# Patient Record
Sex: Male | Born: 1960 | Race: White | Hispanic: No | Marital: Married | Smoking: Never smoker
Health system: Southern US, Community
[De-identification: ages and names within clinical notes are randomized; demographics above are authoritative.]

## PROBLEM LIST (undated history)

## (undated) DIAGNOSIS — I10 Essential (primary) hypertension: Secondary | ICD-10-CM

## (undated) DIAGNOSIS — J189 Pneumonia, unspecified organism: Secondary | ICD-10-CM

## (undated) DIAGNOSIS — K602 Anal fissure, unspecified: Secondary | ICD-10-CM

## (undated) DIAGNOSIS — K219 Gastro-esophageal reflux disease without esophagitis: Secondary | ICD-10-CM

## (undated) DIAGNOSIS — I4891 Unspecified atrial fibrillation: Secondary | ICD-10-CM

## (undated) DIAGNOSIS — I38 Endocarditis, valve unspecified: Secondary | ICD-10-CM

## (undated) DIAGNOSIS — E785 Hyperlipidemia, unspecified: Secondary | ICD-10-CM

## (undated) DIAGNOSIS — K5790 Diverticulosis of intestine, part unspecified, without perforation or abscess without bleeding: Secondary | ICD-10-CM

## (undated) DIAGNOSIS — K648 Other hemorrhoids: Secondary | ICD-10-CM

## (undated) DIAGNOSIS — I499 Cardiac arrhythmia, unspecified: Secondary | ICD-10-CM

## (undated) HISTORY — PX: KNEE SURGERY: SHX244

## (undated) HISTORY — PX: ESOPHAGOGASTRODUODENOSCOPY: SHX1529

## (undated) HISTORY — PX: COLONOSCOPY: SHX174

## (undated) HISTORY — PX: EYE SURGERY: SHX253

---

## 2006-05-30 ENCOUNTER — Encounter: Payer: Self-pay | Admitting: Orthopaedic Surgery

## 2008-04-27 ENCOUNTER — Other Ambulatory Visit: Payer: Self-pay

## 2008-04-27 ENCOUNTER — Ambulatory Visit: Payer: Self-pay | Admitting: Internal Medicine

## 2010-08-08 ENCOUNTER — Ambulatory Visit: Payer: Self-pay | Admitting: Unknown Physician Specialty

## 2010-08-10 LAB — PATHOLOGY REPORT

## 2011-09-05 ENCOUNTER — Ambulatory Visit: Payer: Self-pay | Admitting: Podiatry

## 2015-06-21 DIAGNOSIS — J189 Pneumonia, unspecified organism: Secondary | ICD-10-CM

## 2015-06-21 HISTORY — DX: Pneumonia, unspecified organism: J18.9

## 2015-07-04 ENCOUNTER — Ambulatory Visit
Admission: EM | Admit: 2015-07-04 | Discharge: 2015-07-04 | Disposition: A | Discharge status: Home / Self Care | Payer: Managed Care, Other (non HMO) | Attending: Family Medicine | Admitting: Family Medicine

## 2015-07-04 ENCOUNTER — Encounter: Payer: Self-pay | Admitting: Emergency Medicine

## 2015-07-04 ENCOUNTER — Ambulatory Visit (INDEPENDENT_AMBULATORY_CARE_PROVIDER_SITE_OTHER): Payer: Managed Care, Other (non HMO)

## 2015-07-04 DIAGNOSIS — J181 Lobar pneumonia, unspecified organism: Principal | ICD-10-CM

## 2015-07-04 DIAGNOSIS — J189 Pneumonia, unspecified organism: Secondary | ICD-10-CM

## 2015-07-04 HISTORY — DX: Gastro-esophageal reflux disease without esophagitis: K21.9

## 2015-07-04 HISTORY — DX: Essential (primary) hypertension: I10

## 2015-07-04 HISTORY — DX: Unspecified atrial fibrillation: I48.91

## 2015-07-04 MED ORDER — BENZONATATE 100 MG PO CAPS: 100.0000 mg | ORAL_CAPSULE | Freq: Three times a day (TID) | ORAL | Status: DC | PRN

## 2015-07-04 MED ORDER — AZITHROMYCIN 250 MG PO TABS
500.0000 mg | ORAL_TABLET | Freq: Every day | ORAL | Status: AC
Start: 2015-07-04 — End: 2015-07-08

## 2015-07-04 NOTE — ED Provider Notes (Signed)
[facility]  ____________________________________________  Time seen: Approximately 10:47 AM  I have reviewed the triage vital signs and the nursing notes.   HISTORY  Chief Complaint Cough   HPI Cody Caldwell is a 54 y.o. male presents with complaints of 1 week of cough and congestion. States runny nose and congestion have improved and now just with cough. States occasionally productive cough of white sputum but states most often a dry cough. States that he can feel congestion in his chest and states is almost like a rattle. Denies chest pain or shortness of breath. Denies wheezes. Denies fevers. Reports continues to eat and drink well. Denies known sick contacts. Denies weakness, dizziness, extremity swelling, calf pain, fever, neck or back pain.  Patient reports no recent antibodies. Reports nonsmoker.   Past Medical History  Diagnosis Date  . GERD (gastroesophageal reflux disease)   . Hypertension   . Atrial fibrillation [facility]): intermittent     There are no active problems to display for this patient.   Past Surgical History  Procedure Laterality Date  . Knee surgery Right     Current Outpatient Rx  Name  Route  Sig  Dispense  Refill  . aspirin 81 MG tablet   Oral   Take 81 mg by mouth daily.         Marland Kitchen lisinopril (PRINIVIL,ZESTRIL) 10 MG tablet   Oral   Take 10 mg by mouth daily.         Marland Kitchen omeprazole (PRILOSEC) 20 MG capsule   Oral   Take 20 mg by mouth daily.         . propafenone (RYTHMOL SR) 225 MG 12 hr capsule   Oral   Take 225 mg by mouth 2 (two) times daily.           Allergies Review of patient's allergies indicates no known allergies.  History reviewed. No pertinent family history.  Social History Social History  Substance Use Topics  . Smoking status: Never Smoker   . Smokeless tobacco: None  . Alcohol Use: Yes   PCP Nehemiah Massed  Review of Systems Constitutional: No fever/chills Eyes: No visual changes. ENT: No sore  throat. Positive runny nose, nasal congestion. Cardiovascular: Denies chest pain. Respiratory: Denies shortness of breath. Positive intermittent cough. Gastrointestinal: No abdominal pain.  No nausea, no vomiting.  No diarrhea.  No constipation. Genitourinary: Negative for dysuria. Musculoskeletal: Negative for back pain. Skin: Negative for rash. Neurological: Negative for headaches, focal weakness or numbness.  10-point ROS otherwise negative.  ____________________________________________   PHYSICAL EXAM:  VITAL SIGNS: ED Triage Vitals  Enc Vitals Group     BP 07/04/15 1007 142/96 mmHg     Pulse Rate 07/04/15 1007 83     Resp 07/04/15 1007 16     Temp 07/04/15 1007 97.2 F (36.2 C)     Temp Source 07/04/15 1007 Tympanic     SpO2 07/04/15 1007 96 %     Weight 07/04/15 1007 250 lb (113.399 kg)     Height 07/04/15 1007 6\' 1"  (1.854 m)     Head Cir --      Peak Flow --      Pain Score --      Pain Loc --      Pain Edu? --      Excl. in [place]? --     Constitutional: Alert and oriented. Well appearing and in no acute distress. Eyes: Conjunctivae are normal. PERRL. EOMI. Head: Atraumatic. No sinus tenderness  to palpation. No swelling. No erythema.  Ears: no erythema, normal TMs bilaterally.   Nose: Mild clear rhinorrhea.  Mouth/Throat: Mucous membranes are moist.  Oropharynx non-erythematous. No tonsillar swelling or exudate. No uvular shift or deviation. Neck: No stridor.  No cervical spine tenderness to palpation. Hematological/Lymphatic/Immunilogical: No cervical lymphadenopathy. Cardiovascular: Normal rate, regular rhythm. Grossly normal heart sounds.  Good peripheral circulation. Respiratory: Normal respiratory effort.  No retractions. Scattered right upper and lower lung rhonchi. No wheezes or rales. Good air movement. Dry intermittent cough in room. Gastrointestinal: Soft and nontender. No distention. Normal Bowel sounds.   No CVA tenderness. Musculoskeletal: No lower or  upper extremity tenderness nor edema.  No joint effusions. Bilateral pedal pulses equal and easily palpated.  Neurologic:  Normal speech and language. No gross focal neurologic deficits are appreciated. No gait instability. Skin:  Skin is warm, dry and intact. No rash noted. Psychiatric: Mood and affect are normal. Speech and behavior are normal.  ____________________________________________   LABS (all labs ordered are listed, but only abnormal results are displayed)  Labs Reviewed - No data to display  RADIOLOGY  EXAM: CHEST 2 VIEW  COMPARISON: None.  FINDINGS: Midline trachea. Normal heart size. Tortuous descending thoracic aorta. No pleural effusion or pneumothorax. Mild right hemidiaphragm elevation. Posterior and medial right upper lobe patchy pulmonary opacity. Clear left lung. Anterior lung scarring on the lateral view.  IMPRESSION: Patchy right upper lobe opacity, suspicious for pneumonia. Followup PA and lateral chest X-ray is recommended in 3-4 weeks following trial of antibiotic therapy to ensure resolution and exclude underlying malignancy.   Electronically Signed By: Abigail Miyamoto M.D. On: 07/04/2015 11:47  I, Marylene Land, personally viewed and evaluated these images (plain radiographs) as part of my medical decision making.     INITIAL IMPRESSION / ASSESSMENT AND PLAN / ED COURSE  Pertinent labs & imaging results that were available during my care of the patient were reviewed by me and considered in my medical decision making (see chart for details).  Very well-appearing patient. No acute distress. Presents for the complaints of 1 week of cough and congestion. States congestion has improved now with primarily chest congestion and cough. Denies chest pain or shortness of breath. Denies fevers. Scattered rhonchi right lower and upper lobe of will evaluate chest x-ray.   Chest x-ray patchy right upper lobe opacity. Will treat right upper lobe  pneumonia with oral azithromycin, when necessary tessalon perles as well as encouraged rest and fluids.Discussed follow up with Primary care physician this week an discussed with patient and encouraged repeat follow-up chest x-ray in approximately 3-4 weeks to ensure clearance.  Discussed follow up and return parameters including no resolution or any worsening concerns. Patient verbalized understanding and agreed to plan.   ____________________________________________   FINAL CLINICAL IMPRESSION(S) / ED DIAGNOSES  Final diagnoses:  Right upper lobe pneumonia       Marylene Land, NP 07/06/15 [place], NP 07/06/15 1102

## 2015-07-04 NOTE — ED Notes (Signed)
Patient c/o cough and chest congestion for a week.  Patient denies fevers.  °

## 2015-07-04 NOTE — Discharge Instructions (Signed)
Take medication as prescribed. Rest. Drink and eat well.   Follow-up closely with your primary care physician Dr. Kandice Robinsons. Return to the urgent care or proceed to emergency room for chest pain, shortness of breath, fever, palpitations, weakness, new or worsening concerns.  Community-Acquired Pneumonia, Adult Pneumonia is an infection of the lungs. One type of pneumonia can happen while a person is in a hospital. A different type can happen when a person is not in a hospital (community-acquired pneumonia). It is easy for this kind to spread from person to person. It can spread to you if you breathe near an infected person who coughs or sneezes. Some symptoms include:  A dry cough.  A wet (productive) cough.  Fever.  Sweating.  Chest pain. HOME CARE  Take over-the-counter and prescription medicines only as told by your doctor.  Only take cough medicine if you are losing sleep.  If you were prescribed an antibiotic medicine, take it as told by your doctor. Do not stop taking the antibiotic even if you start to feel better.  Sleep with your head and neck raised (elevated). You can do this by putting a few pillows under your head, or you can sleep in a recliner.  Do not use tobacco products. These include cigarettes, chewing tobacco, and e-cigarettes. If you need help quitting, ask your doctor.  Drink enough water to keep your pee (urine) clear or pale yellow. A shot (vaccine) can help prevent pneumonia. Shots are often suggested for:  People older than 55 years of age.  People older than 55 years of age:  Who are having cancer treatment.  Who have long-term (chronic) lung disease.  Who have problems with their body's defense system (immune system). You may also prevent pneumonia if you take these actions:  Get the flu (influenza) shot every year.  Go to the dentist as often as told.  Wash your hands often. If soap and water are not available, use hand sanitizer. GET HELP  IF:  You have a fever.  You lose sleep because your cough medicine does not help. GET HELP RIGHT AWAY IF:  You are short of breath and it gets worse.  You have more chest pain.  Your sickness gets worse. This is very serious if:  You are an older adult.  Your body's defense system is weak.  You cough up blood.   This information is not intended to replace advice given to you by your health care provider. Make sure you discuss any questions you have with your health care provider.   Document Released: 01/23/2008 Document Revised: 04/27/2015 Document Reviewed: 12/01/2014 Elsevier Interactive Patient Education [employer].

## 2015-09-09 ENCOUNTER — Ambulatory Visit
Admission: RE | Admit: 2015-09-09 | Payer: Managed Care, Other (non HMO) | Source: Ambulatory Visit | Admitting: Unknown Physician Specialty

## 2015-09-09 ENCOUNTER — Encounter: Admission: RE | Payer: Self-pay | Source: Ambulatory Visit

## 2015-09-09 SURGERY — COLONOSCOPY WITH PROPOFOL
Anesthesia: General

## 2015-11-10 ENCOUNTER — Encounter: Payer: Self-pay | Admitting: *Deleted

## 2015-11-11 ENCOUNTER — Ambulatory Visit
Admission: RE | Admit: 2015-11-11 | Discharge: 2015-11-11 | Disposition: A | Discharge status: Home / Self Care | Payer: Managed Care, Other (non HMO) | Source: Ambulatory Visit | Attending: Unknown Physician Specialty | Admitting: Unknown Physician Specialty

## 2015-11-11 ENCOUNTER — Ambulatory Visit: Payer: Managed Care, Other (non HMO) | Admitting: Anesthesiology

## 2015-11-11 ENCOUNTER — Encounter
Admission: RE | Disposition: A | Discharge status: Home / Self Care | Payer: Self-pay | Source: Ambulatory Visit | Attending: Unknown Physician Specialty

## 2015-11-11 ENCOUNTER — Encounter: Payer: Self-pay | Admitting: *Deleted

## 2015-11-11 DIAGNOSIS — K5730 Diverticulosis of large intestine without perforation or abscess without bleeding: Secondary | ICD-10-CM | POA: Diagnosis not present

## 2015-11-11 DIAGNOSIS — Z79899 Other long term (current) drug therapy: Secondary | ICD-10-CM | POA: Diagnosis not present

## 2015-11-11 DIAGNOSIS — I10 Essential (primary) hypertension: Secondary | ICD-10-CM | POA: Insufficient documentation

## 2015-11-11 DIAGNOSIS — E785 Hyperlipidemia, unspecified: Secondary | ICD-10-CM | POA: Insufficient documentation

## 2015-11-11 DIAGNOSIS — K2270 Barrett's esophagus without dysplasia: Secondary | ICD-10-CM | POA: Insufficient documentation

## 2015-11-11 DIAGNOSIS — Z9889 Other specified postprocedural states: Secondary | ICD-10-CM | POA: Insufficient documentation

## 2015-11-11 DIAGNOSIS — Z7982 Long term (current) use of aspirin: Secondary | ICD-10-CM | POA: Insufficient documentation

## 2015-11-11 DIAGNOSIS — I4891 Unspecified atrial fibrillation: Secondary | ICD-10-CM | POA: Insufficient documentation

## 2015-11-11 DIAGNOSIS — K640 First degree hemorrhoids: Secondary | ICD-10-CM | POA: Insufficient documentation

## 2015-11-11 DIAGNOSIS — I38 Endocarditis, valve unspecified: Secondary | ICD-10-CM | POA: Diagnosis not present

## 2015-11-11 DIAGNOSIS — K219 Gastro-esophageal reflux disease without esophagitis: Secondary | ICD-10-CM | POA: Diagnosis not present

## 2015-11-11 DIAGNOSIS — Z800 Family history of malignant neoplasm of digestive organs: Secondary | ICD-10-CM | POA: Diagnosis present

## 2015-11-11 DIAGNOSIS — K210 Gastro-esophageal reflux disease with esophagitis: Secondary | ICD-10-CM | POA: Insufficient documentation

## 2015-11-11 DIAGNOSIS — Z1211 Encounter for screening for malignant neoplasm of colon: Secondary | ICD-10-CM | POA: Insufficient documentation

## 2015-11-11 HISTORY — DX: Pneumonia, unspecified organism: J18.9

## 2015-11-11 HISTORY — PX: COLONOSCOPY WITH PROPOFOL: SHX5780

## 2015-11-11 HISTORY — PX: ESOPHAGOGASTRODUODENOSCOPY (EGD) WITH PROPOFOL: SHX5813

## 2015-11-11 HISTORY — DX: Endocarditis, valve unspecified: I38

## 2015-11-11 HISTORY — DX: Hyperlipidemia, unspecified: E78.5

## 2015-11-11 SURGERY — COLONOSCOPY WITH PROPOFOL
Anesthesia: General

## 2015-11-11 MED ORDER — GLYCOPYRROLATE 0.2 MG/ML IJ SOLN
INTRAMUSCULAR | Status: DC | PRN
  Administered 2015-11-11: 0.1 mg via INTRAVENOUS

## 2015-11-11 MED ORDER — PROPOFOL 500 MG/50ML IV EMUL
INTRAVENOUS | Status: DC | PRN
  Administered 2015-11-11: 140 ug/kg/min via INTRAVENOUS

## 2015-11-11 MED ORDER — LIDOCAINE HCL (CARDIAC) 20 MG/ML IV SOLN
INTRAVENOUS | Status: DC | PRN
  Administered 2015-11-11: 40 mg via INTRAVENOUS

## 2015-11-11 MED ORDER — SODIUM CHLORIDE 0.9 % IV SOLN
INTRAVENOUS | Status: DC
  Administered 2015-11-11: 09:00:00 via INTRAVENOUS

## 2015-11-11 MED ORDER — FENTANYL CITRATE (PF) 100 MCG/2ML IJ SOLN
INTRAMUSCULAR | Status: DC | PRN
  Administered 2015-11-11: 50 ug via INTRAVENOUS

## 2015-11-11 MED ORDER — MIDAZOLAM HCL 2 MG/2ML IJ SOLN
INTRAMUSCULAR | Status: DC | PRN
  Administered 2015-11-11: 1 mg via INTRAVENOUS

## 2015-11-11 MED ORDER — SODIUM CHLORIDE 0.9 % IV SOLN: INTRAVENOUS | Status: DC

## 2015-11-11 NOTE — H&P (Signed)
   Primary Care Physician:  Pcp Not In System Primary Gastroenterologist:  Dr. Vira Agar  Pre-Procedure History & Physical: HPI:  Cody Caldwell is a 55 y.o. male is here for a colonoscopy and upper endoscopy   Past Medical History  Diagnosis Date  . GERD (gastroesophageal reflux disease)   . Hypertension   . Atrial fibrillation ([place])   . Hyperlipidemia   . VHD (valvular heart disease)   . Pneumonia 06/2015    Past Surgical History  Procedure Laterality Date  . Knee surgery Right   . Colonoscopy    . Esophagogastroduodenoscopy      Prior to Admission medications   Medication Sig Start Date End Date Taking? Authorizing Provider  lisinopril (PRINIVIL,ZESTRIL) 10 MG tablet Take 10 mg by mouth daily.   Yes Historical Provider, MD  omeprazole (PRILOSEC) 20 MG capsule Take 20 mg by mouth daily.   Yes Historical Provider, MD  propafenone (RYTHMOL SR) 225 MG 12 hr capsule Take 225 mg by mouth 2 (two) times daily.   Yes Historical Provider, MD  aspirin 81 MG tablet Take 81 mg by mouth daily.    Historical Provider, MD  benzonatate (TESSALON PERLES) 100 MG capsule Take 1 capsule (100 mg total) by mouth 3 (three) times daily as needed for cough. 07/04/15   Marylene Land, NP    Allergies as of 09/20/2015  . (No Known Allergies)    History reviewed. No pertinent family history.  Social History   Social History  . Marital Status: Married    Spouse Name: N/A  . Number of Children: N/A  . Years of Education: N/A   Occupational History  . Not on file.   Social History Main Topics  . Smoking status: Never Smoker   . Smokeless tobacco: Never Used  . Alcohol Use: Yes  . Drug Use: Not on file  . Sexual Activity: Not on file   Other Topics Concern  . Not on file   Social History Narrative    Review of Systems: See HPI, otherwise negative ROS  Physical Exam: BP 135/82 mmHg  Pulse 57  Temp(Src) 97.2 F (36.2 C) (Tympanic)  Resp 12  Ht 6\' 1"  (1.854 m)  Wt 113.399 kg  (250 lb)  BMI 32.99 kg/m2  SpO2 100% General:   Alert,  pleasant and cooperative in NAD Head:  Normocephalic and atraumatic. Neck:  Supple; no masses or thyromegaly. Lungs:  Clear throughout to auscultation.    Heart:  Regular rate and rhythm. Abdomen:  Soft, nontender and nondistended. Normal bowel sounds, without guarding, and without rebound.   Neurologic:  Alert and  oriented x4;  grossly normal neurologically.  Impression/Plan: Cody Caldwell is here for an endoscopy and colonoscopy to be performed for [place] [facility]   Risks, benefits, limitations, and alternatives regarding  endoscopy and colonoscopy have been reviewed with the patient.  Questions have been answered.  All parties agreeable.   Gaylyn Cheers, MD  11/11/2015, 9:46 AM

## 2015-11-11 NOTE — Anesthesia Procedure Notes (Signed)
Performed by: COOK-MARTIN, Vernella Niznik Pre-anesthesia Checklist: Patient identified, Emergency Drugs available, Suction available, Patient being monitored and Timeout performed Patient Re-evaluated:Patient Re-evaluated prior to inductionOxygen Delivery Method: Nasal cannula Preoxygenation: Pre-oxygenation with 100% oxygen Intubation Type: IV induction Placement Confirmation: positive ETCO2 and CO2 detector       

## 2015-11-11 NOTE — Op Note (Signed)
[facility] Gastroenterology Patient Name: Cody Caldwell Procedure Date: 11/11/2015 9:51 AM MRN: [id number] Account #: [id number] Date of Birth: 09-26-1960 Admit Type: Outpatient Age: 55 Room: [facility] ENDO ROOM 1 Gender: Male Note Status: Finalized Procedure:            Upper GI endoscopy Indications:          Follow-up of Barrett's esophagus Providers:            Manya Silvas, MD Referring MD:         Shirline Frees (Referring MD) Medicines:            Propofol per Anesthesia Complications:        No immediate complications. Procedure:            Pre-Anesthesia Assessment:                       - After reviewing the risks and benefits, the patient                        was deemed in satisfactory condition to undergo the                        procedure.                       After obtaining informed consent, the endoscope was                        passed under direct vision. Throughout the procedure,                        the patient's blood pressure, pulse, and oxygen                        saturations were monitored continuously. The Endoscope                        was introduced through the mouth, and advanced to the                        second part of duodenum. The upper GI endoscopy was                        accomplished without difficulty. The patient tolerated                        the procedure well. Findings:      There were esophageal mucosal changes secondary to established       short-segment Barrett's disease present in the lower third of the       esophagus. The maximum longitudinal extent of these mucosal changes was       2 cm in length. Mucosa was biopsied with a cold forceps for histology.       One specimen bottle was sent to pathology.      The stomach was normal. Faint erythematous small spot.      The examined duodenum was normal. Faint erythematous spot minimal       significance. Impression:           - Esophageal mucosal  changes secondary to established  short-segment Barrett's disease. Biopsied.                       - Normal stomach.                       - Normal examined duodenum. Recommendation:       - Await pathology results. Manya Silvas, MD 11/11/2015 10:07:59 AM This report has been signed electronically. Number of Addenda: 0 Note Initiated On: 11/11/2015 9:51 AM      [facility]

## 2015-11-11 NOTE — Op Note (Signed)
[facility] Gastroenterology Patient Name: Cody Caldwell Procedure Date: 11/11/2015 9:47 AM MRN: [id number] Account #: [id number] Date of Birth: [birth year] Admit Type: Outpatient Age: 55 Room: [facility] ENDO ROOM 1 Gender: Male Note Status: Finalized Procedure:            Colonoscopy Indications:          Screening in patient at increased risk: Family history                        of 1st-degree relative with colorectal cancer Providers:            Manya Silvas, MD Referring MD:         Shirline Frees (Referring MD) Medicines:            Propofol per Anesthesia Complications:        No immediate complications. Procedure:            Pre-Anesthesia Assessment:                       - After reviewing the risks and benefits, the patient                        was deemed in satisfactory condition to undergo the                        procedure.                       After obtaining informed consent, the colonoscope was                        passed under direct vision. Throughout the procedure,                        the patient's blood pressure, pulse, and oxygen                        saturations were monitored continuously. The                        Colonoscope was introduced through the anus and                        advanced to the the cecum, identified by appendiceal                        orifice and ileocecal valve. The colonoscopy was                        performed without difficulty. The patient tolerated the                        procedure well. The quality of the bowel preparation                        was good. Findings:      Many small-mouthed diverticula were found in the sigmoid colon.      Internal hemorrhoids were found during endoscopy. The hemorrhoids were       small and Grade I (internal hemorrhoids that do not prolapse).      The  exam was otherwise without abnormality. Impression:           - Diverticulosis in the sigmoid colon.                  - Internal hemorrhoids.                       - The examination was otherwise normal.                       - No specimens collected. Recommendation:       - Repeat colonoscopy in 5 years for screening purposes. Manya Silvas, MD 11/11/2015 10:36:50 AM This report has been signed electronically. Number of Addenda: 0 Note Initiated On: 11/11/2015 9:47 AM Scope Withdrawal Time: 0 hours 15 minutes 10 seconds  Total Procedure Duration: 0 hours 23 minutes 17 seconds       [facility]

## 2015-11-11 NOTE — Anesthesia Preprocedure Evaluation (Signed)
Anesthesia Evaluation  Patient identified by MRN, date of birth, ID band Patient awake    Reviewed: Allergy & Precautions, H&P , NPO status , Patient's Chart, lab work & pertinent test results, reviewed documented beta blocker date and time   Airway Mallampati: II   Neck ROM: full    Dental  (+) Poor Dentition   Pulmonary neg pulmonary ROS, pneumonia, resolved,    Pulmonary exam normal        Cardiovascular hypertension, negative cardio ROS Normal cardiovascular examAtrial Fibrillation      Neuro/Psych negative neurological ROS  negative psych ROS   GI/Hepatic negative GI ROS, Neg liver ROS, GERD  ,  Endo/Other  negative endocrine ROS  Renal/GU negative Renal ROS  negative genitourinary   Musculoskeletal   Abdominal   Peds  Hematology negative hematology ROS (+)   Anesthesia Other Findings Past Medical History:   GERD (gastroesophageal reflux disease)                       Hypertension                                                 Atrial fibrillation (H[facility])                                    Hyperlipidemia                                               VHD (valvular heart disease)                                 Pneumonia                                       06/2015    Past Surgical History:   KNEE SURGERY                                    Right              COLONOSCOPY                                                   ESOPHAGOGASTRODUODENOSCOPY                                  BMI    Body Mass Index   32.99 kg/m 2     Reproductive/Obstetrics negative OB ROS                             Anesthesia Physical Anesthesia Plan  ASA: III  Anesthesia Plan: General   Post-op Pain Management:  Induction:   Airway Management Planned:   Additional Equipment:   Intra-op Plan:   Post-operative Plan:   Informed Consent: I have reviewed the patients History and Physical, chart,  labs and discussed the procedure including the risks, benefits and alternatives for the proposed anesthesia with the patient or authorized representative who has indicated his/her understanding and acceptance.   Dental Advisory Given  Plan Discussed with: CRNA  Anesthesia Plan Comments:         Anesthesia Quick Evaluation

## 2015-11-11 NOTE — Transfer of Care (Signed)
Immediate Anesthesia Transfer of Care Note  Patient: Cody Caldwell  Procedure(s) Performed: Procedure(s): COLONOSCOPY WITH PROPOFOL (N/A) ESOPHAGOGASTRODUODENOSCOPY (EGD) WITH PROPOFOL (N/A)  Patient Location: PACU  Anesthesia Type:General  Level of Consciousness: awake and sedated  Airway & Oxygen Therapy: Patient Spontanous Breathing and Patient connected to nasal cannula oxygen  Post-op Assessment: Report given to RN and Post -op Vital signs reviewed and stable  Post vital signs: Reviewed and stable  Last Vitals:  Filed Vitals:   11/11/15 0908  BP: 135/82  Pulse: 57  Temp: 36.2 C  Resp: 12    Complications: No apparent anesthesia complications

## 2015-11-12 ENCOUNTER — Encounter: Payer: Self-pay | Admitting: Unknown Physician Specialty

## 2015-11-14 LAB — SURGICAL PATHOLOGY

## 2015-11-14 NOTE — Anesthesia Postprocedure Evaluation (Signed)
Anesthesia Post Note  Patient: Cody Caldwell  Procedure(s) Performed: Procedure(s) (LRB): COLONOSCOPY WITH PROPOFOL (N/A) ESOPHAGOGASTRODUODENOSCOPY (EGD) WITH PROPOFOL (N/A)  Patient location during evaluation: PACU Anesthesia Type: General Level of consciousness: awake and alert Pain management: pain level controlled Vital Signs Assessment: post-procedure vital signs reviewed and stable Respiratory status: spontaneous breathing, nonlabored ventilation, respiratory function stable and patient connected to nasal cannula oxygen Cardiovascular status: blood pressure returned to baseline and stable Postop Assessment: no signs of nausea or vomiting Anesthetic complications: no    Last Vitals:  Filed Vitals:   11/11/15 1050 11/11/15 1100  BP: 112/90 127/92  Pulse: 66 60  Temp:    Resp: 21 16    Last Pain: There were no vitals filed for this visit.               Molli Barrows

## 2017-08-14 ENCOUNTER — Other Ambulatory Visit: Payer: Self-pay

## 2017-08-14 ENCOUNTER — Encounter: Payer: Self-pay | Admitting: Emergency Medicine

## 2017-08-14 ENCOUNTER — Ambulatory Visit (INDEPENDENT_AMBULATORY_CARE_PROVIDER_SITE_OTHER): Payer: Managed Care, Other (non HMO)

## 2017-08-14 ENCOUNTER — Ambulatory Visit
Admission: EM | Admit: 2017-08-14 | Discharge: 2017-08-14 | Disposition: A | Discharge status: Home / Self Care | Payer: Managed Care, Other (non HMO) | Attending: Emergency Medicine | Admitting: Emergency Medicine

## 2017-08-14 DIAGNOSIS — J069 Acute upper respiratory infection, unspecified: Secondary | ICD-10-CM

## 2017-08-14 DIAGNOSIS — R05 Cough: Secondary | ICD-10-CM

## 2017-08-14 DIAGNOSIS — B9789 Other viral agents as the cause of diseases classified elsewhere: Secondary | ICD-10-CM

## 2017-08-14 MED ORDER — AEROCHAMBER PLUS MISC: 2 refills | Status: DC

## 2017-08-14 MED ORDER — PREDNISONE 50 MG PO TABS: 50.0000 mg | ORAL_TABLET | Freq: Every day | ORAL | 0 refills | Status: AC

## 2017-08-14 MED ORDER — BENZONATATE 200 MG PO CAPS: 200.0000 mg | ORAL_CAPSULE | Freq: Three times a day (TID) | ORAL | 0 refills | Status: DC | PRN

## 2017-08-14 MED ORDER — HYDROCOD POLST-CPM POLST ER 10-8 MG/5ML PO SUER: 5.0000 mL | Freq: Two times a day (BID) | ORAL | 0 refills | Status: DC | PRN

## 2017-08-14 MED ORDER — ALBUTEROL SULFATE HFA 108 (90 BASE) MCG/ACT IN AERS
1.0000 | INHALATION_SPRAY | Freq: Four times a day (QID) | RESPIRATORY_TRACT | 0 refills | Status: DC | PRN
Start: 2017-08-14 — End: 2019-09-17

## 2017-08-14 MED ORDER — IPRATROPIUM-ALBUTEROL 0.5-2.5 (3) MG/3ML IN SOLN
3.0000 mL | Freq: Once | RESPIRATORY_TRACT | Status: AC
  Administered 2017-08-14: 3 mL via RESPIRATORY_TRACT

## 2017-08-14 MED ORDER — FLUTICASONE PROPIONATE 50 MCG/ACT NA SUSP: 2.0000 | Freq: Every day | NASAL | 0 refills | Status: DC

## 2017-08-14 NOTE — ED Triage Notes (Signed)
Patient c/o cough and chest congestion since Dec. 20.  Patient denies fevers.

## 2017-08-14 NOTE — ED Provider Notes (Signed)
HPI  SUBJECTIVE:  Cody Caldwell is a 56 y.o. male who presents with 3 days of a cough productive of whitish mucus, chest congestion.  He reports 1 day of a headache, but this is since resolved.  Reports wheezing today.  Also postnasal drip, scratchy, irritated sore throat.  No fevers, body aches, chest pain, shortness of breath, shortness of breath with exertion.  No nasal congestion, rhinorrhea.  He denies sick contacts.  Did not get a flu shot this year.  He tried Delsym with improvement in his cough.  No aggravating factors.  He had identical symptoms with pneumonia before and wants to make sure that he does not have recurrence.  No antibiotic in the past month.  No antipyretic in the past 6-8 hours.  He has a past medical history of pneumonia, atrial fibrillation for which he takes aspirin 325, hypertension for which he takes lisinopril.  No history of diabetes, asthma, eczema, COPD, smoking.  JAS:[id number], Everlean Cherry, MD     Past Medical History:  Diagnosis Date  . Atrial fibrillation ([place])   . GERD (gastroesophageal reflux disease)   . Hyperlipidemia   . Hypertension   . Pneumonia 06/2015  . VHD (valvular heart disease)     Past Surgical History:  Procedure Laterality Date  . COLONOSCOPY    . COLONOSCOPY WITH PROPOFOL N/A 11/11/2015   Procedure: COLONOSCOPY WITH PROPOFOL;  Surgeon: Manya Silvas, MD;  Location: [facility] ENDOSCOPY;  Service: Endoscopy;  Laterality: N/A;  . ESOPHAGOGASTRODUODENOSCOPY    . ESOPHAGOGASTRODUODENOSCOPY (EGD) WITH PROPOFOL N/A 11/11/2015   Procedure: ESOPHAGOGASTRODUODENOSCOPY (EGD) WITH PROPOFOL;  Surgeon: Manya Silvas, MD;  Location: [facility] ENDOSCOPY;  Service: Endoscopy;  Laterality: N/A;  . KNEE SURGERY Right     History reviewed. No pertinent family history.  Social History   Tobacco Use  . Smoking status: Never Smoker  . Smokeless tobacco: Never Used  Substance Use Topics  . Alcohol use: Yes  . Drug use: Not on file    No current  facility-administered medications for this encounter.   Current Outpatient Medications:  .  aspirin 81 MG tablet, Take 325 mg by mouth daily. , Disp: , Rfl:  .  lisinopril (PRINIVIL,ZESTRIL) 10 MG tablet, Take 10 mg by mouth daily., Disp: , Rfl:  .  omeprazole (PRILOSEC) 20 MG capsule, Take 20 mg by mouth daily., Disp: , Rfl:  .  propafenone (RYTHMOL SR) 225 MG 12 hr capsule, Take 225 mg by mouth 2 (two) times daily., Disp: , Rfl:  .  albuterol (PROVENTIL HFA;VENTOLIN HFA) 108 (90 Base) MCG/ACT inhaler, Inhale 1-2 puffs into the lungs every 6 (six) hours as needed for wheezing or shortness of breath., Disp: 1 Inhaler, Rfl: 0 .  benzonatate (TESSALON) 200 MG capsule, Take 1 capsule (200 mg total) by mouth 3 (three) times daily as needed for cough., Disp: 30 capsule, Rfl: 0 .  chlorpheniramine-HYDROcodone (TUSSIONEX PENNKINETIC ER) 10-8 MG/5ML SUER, Take 5 mLs by mouth every 12 (twelve) hours as needed for cough., Disp: 120 mL, Rfl: 0 .  fluticasone (FLONASE) 50 MCG/ACT nasal spray, Place 2 sprays into both nostrils daily., Disp: 16 g, Rfl: 0 .  predniSONE (DELTASONE) 50 MG tablet, Take 1 tablet (50 mg total) by mouth daily with breakfast for 5 days., Disp: 5 tablet, Rfl: 0 .  Spacer/Aero-Holding Chambers (AEROCHAMBER PLUS) inhaler, Use as instructed, Disp: 1 each, Rfl: 2  No Known Allergies   ROS  As noted in HPI.   Physical Exam  BP Marland Kitchen)  150/94 (BP Location: Left Arm)   Pulse 62   Temp 98.2 F (36.8 C) (Oral)   Resp 16   Ht 6\' 1"  (1.854 m)   Wt 245 lb (111.1 kg)   SpO2 98%   BMI 32.32 kg/m   Constitutional: Well developed, well nourished, no acute distress Eyes:  EOMI, conjunctiva normal bilaterally HENT: Normocephalic, atraumatic,mucus membranes moist and positive nasal congestion with erythematous, swollen turbinates.  No obvious postnasal drip.  No cobblestoning. Respiratory: Normal inspiratory effort fair air movement, positive scattered rales and rhonchi. Cardiovascular:  Normal rate, regular rhythm no murmurs rubs or gallops GI: nondistended skin: No rash, skin intact Musculoskeletal: no deformities Neurologic: Alert & oriented x 3, no focal neuro deficits Psychiatric: Speech and behavior appropriate   ED Course   Medications  ipratropium-albuterol (DUONEB) 0.5-2.5 (3) MG/3ML nebulizer solution 3 mL (3 mLs Nebulization Given 08/14/17 1300)    Orders Placed This Encounter  Procedures  . DG Chest 2 View    Standing Status:   Standing    Number of Occurrences:   1    Order Specific Question:   Reason for Exam (SYMPTOM  OR DIAGNOSIS REQUIRED)    Answer:   cough r/o PNA pulm edema effusion    No results found for this or any previous visit (from the past 24 hour(s)). Dg Chest 2 View  Result Date: 08/14/2017 CLINICAL DATA:  Re- days of productive cough and chest congestion. History of valvular heart disease and atrial fibrillation. Never smoked. EXAM: CHEST  2 VIEW COMPARISON:  Chest x-ray of July 04, 2015 FINDINGS: The lungs are well-expanded and clear. The heart and pulmonary vascularity are normal. The mediastinum is normal in width. There is no pleural effusion. The bony thorax exhibits no acute abnormality. IMPRESSION: There is no active cardiopulmonary disease. Electronically Signed   By: David  Martinique M.D.   On: 08/14/2017 13:01    ED Clinical Impression  Viral URI with cough   ED Assessment/Plan   Will check chest x-ray, give DuoNeb and reevaluate.  Reviewed imaging independently.  No pneumonia.  See radiology report for full details.  On reevaluation, patient states that he feels better.  Lungs clear, improved air movement.  Presentation consistent with a viral URI/bronchitis.  Home with Flonase, albuterol inhaler with a spacer, a 5 days of prednisone 50 mg, Tussionex and Tessalon.  Follow-up with primary care physician as needed, to the ER if he gets worse.  Discussed imaging, MDM, plan and followup with patient. Discussed sn/sx  that should prompt return to the ED. patient agrees with plan.   Meds ordered this encounter  Medications  . ipratropium-albuterol (DUONEB) 0.5-2.5 (3) MG/3ML nebulizer solution 3 mL  . albuterol (PROVENTIL HFA;VENTOLIN HFA) 108 (90 Base) MCG/ACT inhaler    Sig: Inhale 1-2 puffs into the lungs every 6 (six) hours as needed for wheezing or shortness of breath.    Dispense:  1 Inhaler    Refill:  0  . Spacer/Aero-Holding Chambers (AEROCHAMBER PLUS) inhaler    Sig: Use as instructed    Dispense:  1 each    Refill:  2  . benzonatate (TESSALON) 200 MG capsule    Sig: Take 1 capsule (200 mg total) by mouth 3 (three) times daily as needed for cough.    Dispense:  30 capsule    Refill:  0  . chlorpheniramine-HYDROcodone (TUSSIONEX PENNKINETIC ER) 10-8 MG/5ML SUER    Sig: Take 5 mLs by mouth every 12 (twelve) hours as needed for cough.  Dispense:  120 mL    Refill:  0  . predniSONE (DELTASONE) 50 MG tablet    Sig: Take 1 tablet (50 mg total) by mouth daily with breakfast for 5 days.    Dispense:  5 tablet    Refill:  0  . fluticasone (FLONASE) 50 MCG/ACT nasal spray    Sig: Place 2 sprays into both nostrils daily.    Dispense:  16 g    Refill:  0    *This clinic note was created using [employer]. Therefore, there may be occasional mistakes despite careful proofreading.   ?   Melynda Ripple, MD 08/14/17 [phone]

## 2018-01-14 ENCOUNTER — Other Ambulatory Visit: Payer: Self-pay | Admitting: Student

## 2018-01-14 DIAGNOSIS — M1712 Unilateral primary osteoarthritis, left knee: Secondary | ICD-10-CM

## 2018-01-14 DIAGNOSIS — M25562 Pain in left knee: Secondary | ICD-10-CM

## 2018-01-25 ENCOUNTER — Ambulatory Visit
Admission: RE | Admit: 2018-01-25 | Discharge: 2018-01-25 | Disposition: A | Discharge status: Home / Self Care | Payer: Managed Care, Other (non HMO) | Source: Ambulatory Visit | Attending: Student | Admitting: Student

## 2018-01-25 DIAGNOSIS — M2342 Loose body in knee, left knee: Secondary | ICD-10-CM | POA: Diagnosis not present

## 2018-01-25 DIAGNOSIS — S83272A Complex tear of lateral meniscus, current injury, left knee, initial encounter: Secondary | ICD-10-CM | POA: Insufficient documentation

## 2018-01-25 DIAGNOSIS — M1712 Unilateral primary osteoarthritis, left knee: Secondary | ICD-10-CM | POA: Diagnosis not present

## 2018-01-25 DIAGNOSIS — M25562 Pain in left knee: Secondary | ICD-10-CM | POA: Diagnosis present

## 2019-03-24 ENCOUNTER — Other Ambulatory Visit: Payer: Self-pay | Admitting: Nurse Practitioner

## 2019-03-24 ENCOUNTER — Ambulatory Visit
Admission: RE | Admit: 2019-03-24 | Discharge: 2019-03-24 | Disposition: A | Discharge status: Home / Self Care | Payer: Managed Care, Other (non HMO) | Source: Ambulatory Visit | Attending: Nurse Practitioner | Admitting: Nurse Practitioner

## 2019-03-24 ENCOUNTER — Other Ambulatory Visit: Payer: Self-pay

## 2019-03-24 DIAGNOSIS — R197 Diarrhea, unspecified: Secondary | ICD-10-CM

## 2019-03-24 DIAGNOSIS — K625 Hemorrhage of anus and rectum: Secondary | ICD-10-CM

## 2019-03-24 DIAGNOSIS — R1031 Right lower quadrant pain: Secondary | ICD-10-CM

## 2019-03-25 ENCOUNTER — Ambulatory Visit
Admission: RE | Admit: 2019-03-25 | Discharge: 2019-03-25 | Disposition: A | Discharge status: Home / Self Care | Payer: Managed Care, Other (non HMO) | Source: Ambulatory Visit | Attending: Nurse Practitioner | Admitting: Nurse Practitioner

## 2019-03-25 DIAGNOSIS — R197 Diarrhea, unspecified: Secondary | ICD-10-CM

## 2019-03-25 DIAGNOSIS — K625 Hemorrhage of anus and rectum: Secondary | ICD-10-CM | POA: Diagnosis present

## 2019-03-25 IMAGING — CT CT ABDOMEN AND PELVIS WITH CONTRAST
2 of 5 series · 16 of 46 positions shown, 18 images · IV contrast (APPLIED)
Comparison: None.

CLINICAL DATA: Patient complains of rectal bleeding which began 2
days ago.

EXAM:
CT ABDOMEN AND PELVIS WITH CONTRAST
TECHNIQUE: Multidetector CT imaging of the abdomen and pelvis was performed
using the standard protocol following bolus administration of
intravenous contrast.
CONTRAST:  100mL OMNIPAQUE IOHEXOL 300 MG/ML  SOLN

[Series 2: routine abd/pel with · axial · 0.80mm/px · z∈[-503,-63]mm · 13 of 100 slices shown, 15 images]
[im 6/100  soft-tissue]
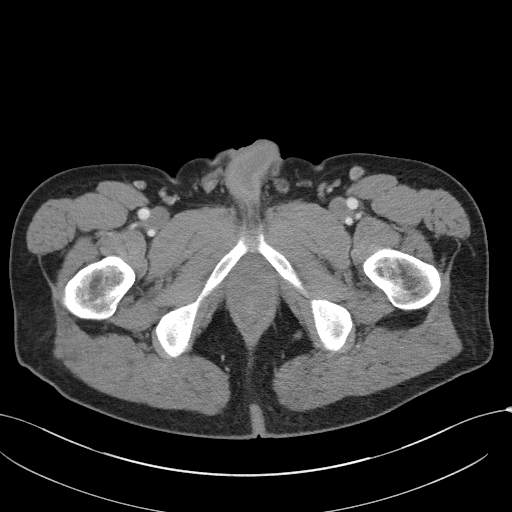
[im 6/100  bone]
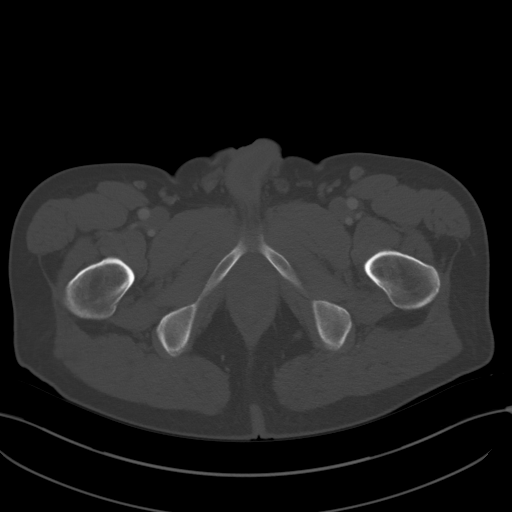
[im 12/100  soft-tissue]
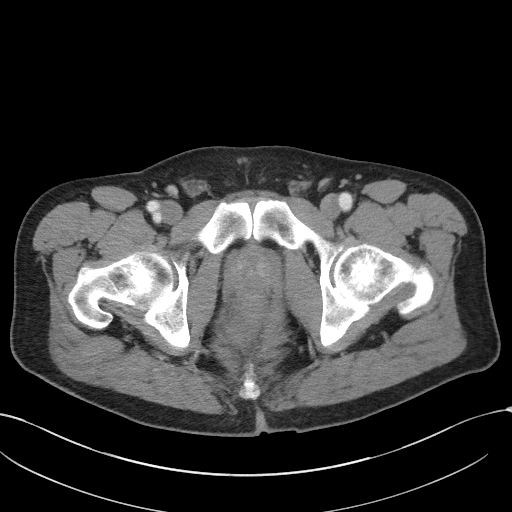
[im 23/100  soft-tissue]
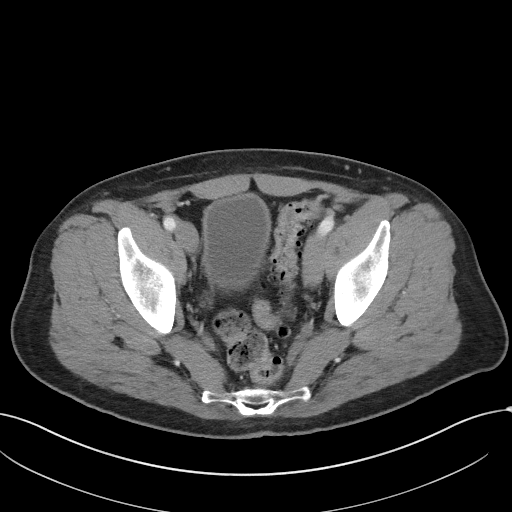
[im 28/100  soft-tissue]
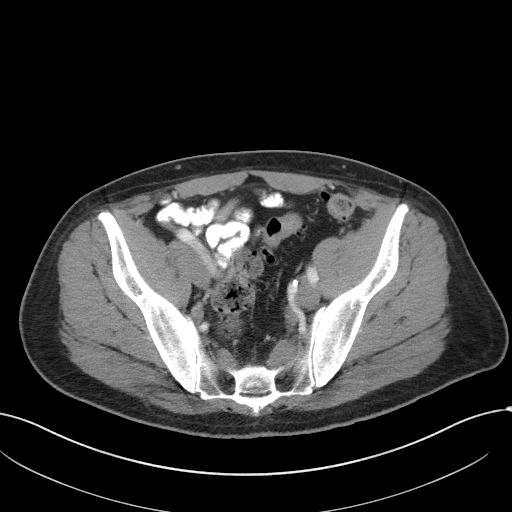
[im 34/100  soft-tissue]
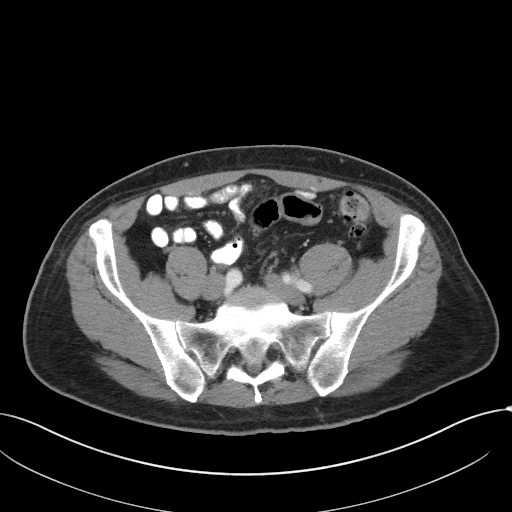
[im 45/100  soft-tissue]
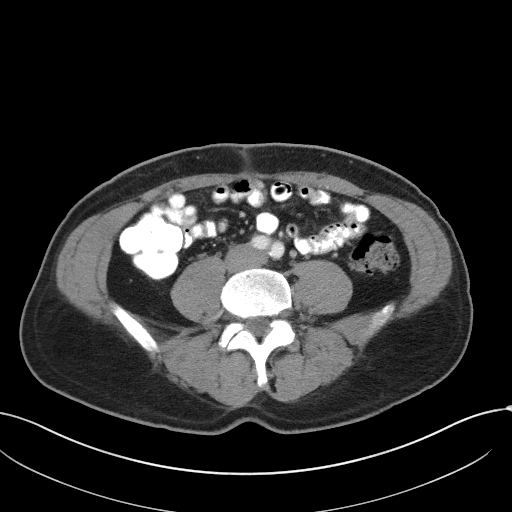
[im 50/100  soft-tissue]
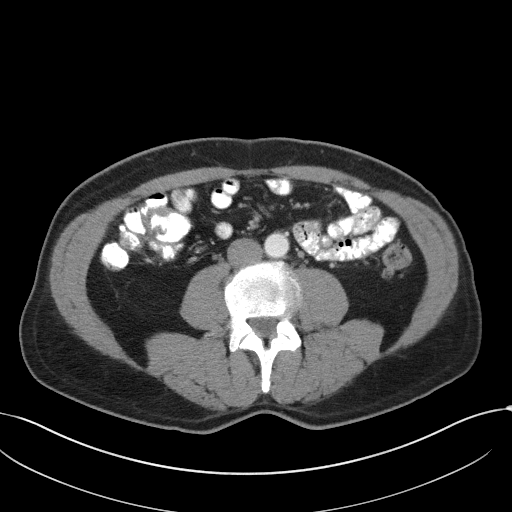
[im 56/100  soft-tissue]
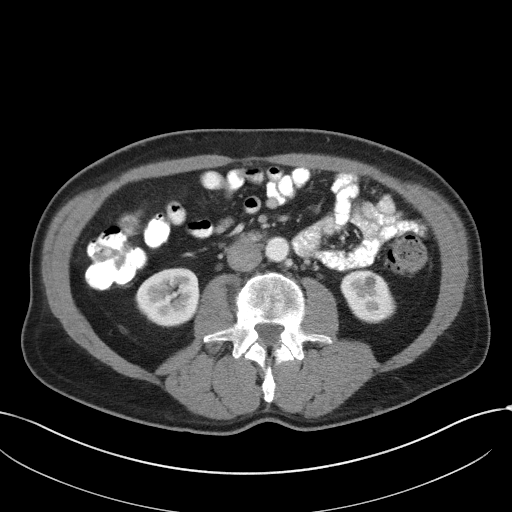
[im 67/100  soft-tissue]
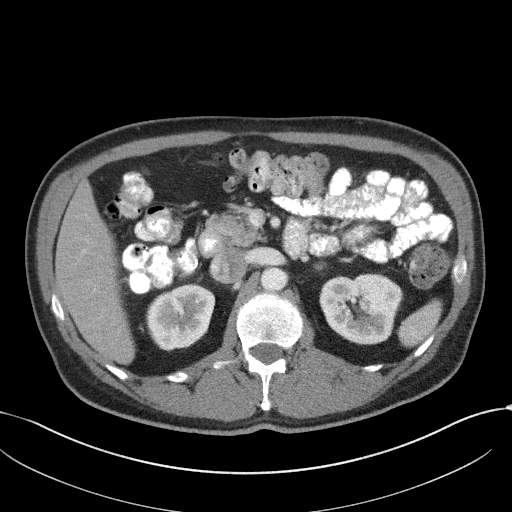
[im 67/100  bone]
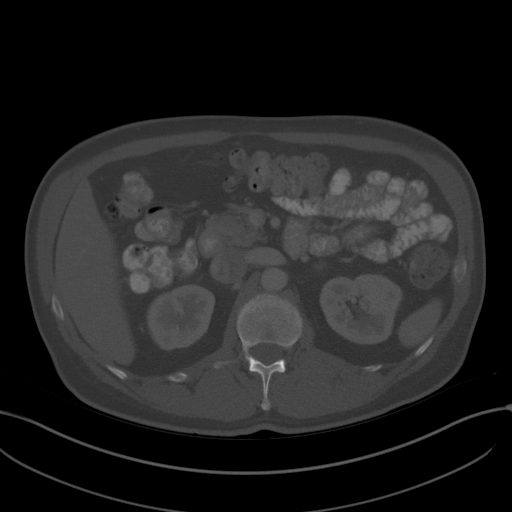
[im 72/100  soft-tissue]
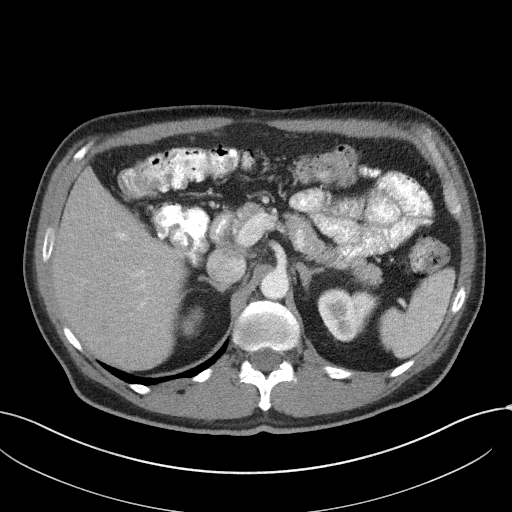
[im 78/100  soft-tissue]
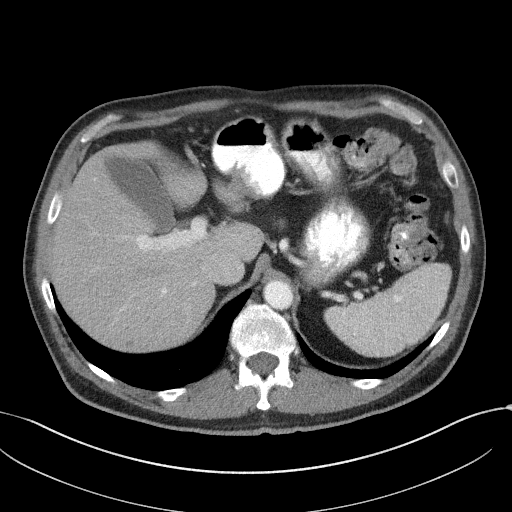
[im 89/100  soft-tissue]
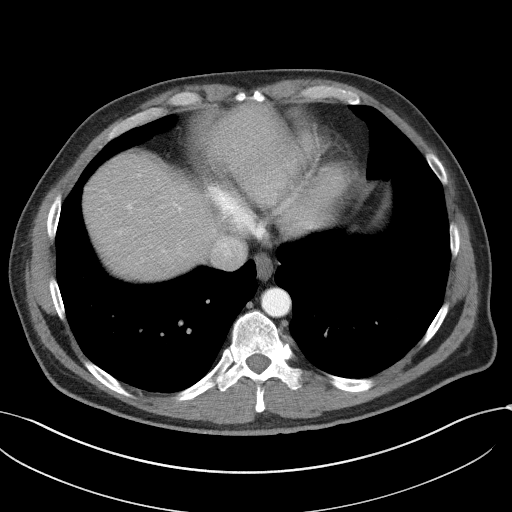
[im 94/100  soft-tissue]
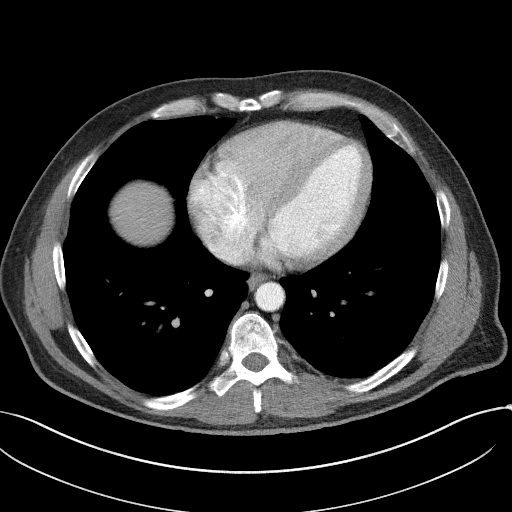

[Series 5: coronal st · coronal · 0.81mm/px · 3 of 99 slices shown]
[im 33/99  soft-tissue]
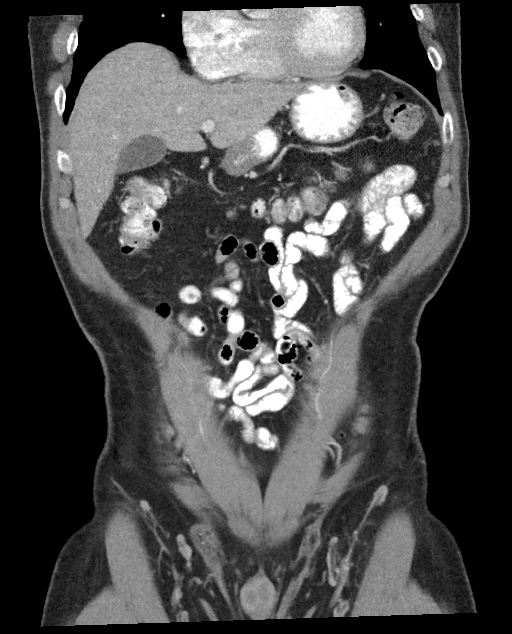
[im 44/99  soft-tissue]
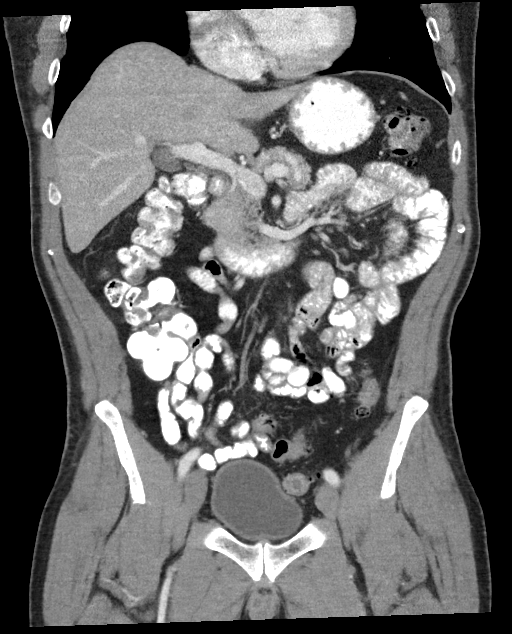
[im 55/99  soft-tissue]
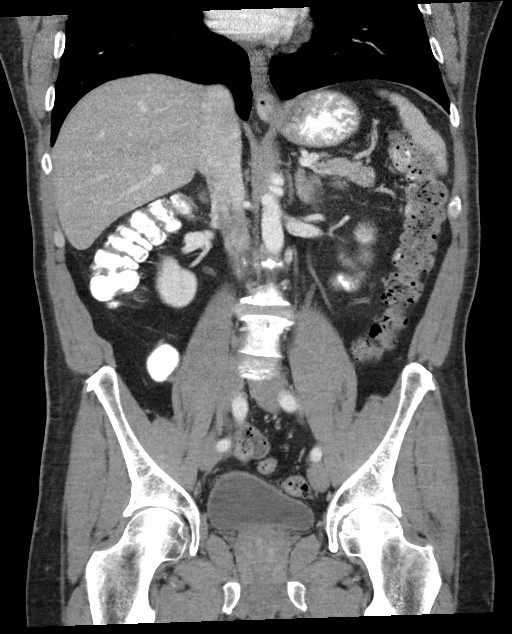

[16 of 46 positions shown; findings below may reference images not displayed]

FINDINGS: Lower chest: No acute abnormality.

Hepatobiliary: No focal liver abnormality is seen. No gallstones,
gallbladder wall thickening, or biliary dilatation.

Pancreas: Unremarkable. No pancreatic ductal dilatation or
surrounding inflammatory changes.

Spleen: Normal in size without focal abnormality.

Adrenals/Urinary Tract: Normal adrenal glands. Small, 6 mm
low-density structure in inferior pole of left kidney is unchanged,
too small to characterize. No mass or hydronephrosis identified. The
urinary bladder appears normal.

Stomach/Bowel: Stomach is normal. The small bowel loops have a
normal course and caliber. Appendix visualized and unremarkable.
Left-sided diverticulosis. No obstructing: Mass identified. There is
mild circumferential wall thickening involving the distal rectum
which measures up to 7.5 mm, image 87/2.

Vascular/Lymphatic: Mild aortic atherosclerosis.

No abdominal adenopathy.  No pelvic adenopathy.

Reproductive: Prostate is unremarkable.

Other: No abdominal wall hernia or abnormality. No abdominopelvic
ascites.

Musculoskeletal: No acute or significant osseous findings. Mild
lumbar scoliosis and degenerative disc disease.
IMPRESSION: 1. No acute findings identified within the abdomen or pelvis. No
obstructing mass identified within the colon.
2. There is mild circumferential wall thickening involving the
distal rectum, nonspecific. In the setting of rectal bleeding
recommend correlation with physical exam and direct visualization to
exclude colorectal neoplasm.
3. Colonic diverticulosis without acute inflammation.
4.  Aortic Atherosclerosis ([id number]).

## 2019-03-25 MED ORDER — IOHEXOL 300 MG/ML  SOLN
100.0000 mL | Freq: Once | INTRAMUSCULAR | Status: AC | PRN
  Administered 2019-03-25: 100 mL via INTRAVENOUS

## 2019-03-26 ENCOUNTER — Other Ambulatory Visit: Payer: Self-pay

## 2019-03-26 ENCOUNTER — Other Ambulatory Visit
Admission: RE | Admit: 2019-03-26 | Discharge: 2019-03-26 | Disposition: A | Discharge status: Home / Self Care | Payer: Managed Care, Other (non HMO) | Source: Ambulatory Visit | Attending: Internal Medicine | Admitting: Internal Medicine

## 2019-03-26 DIAGNOSIS — Z01812 Encounter for preprocedural laboratory examination: Secondary | ICD-10-CM | POA: Insufficient documentation

## 2019-03-26 DIAGNOSIS — Z20828 Contact with and (suspected) exposure to other viral communicable diseases: Secondary | ICD-10-CM | POA: Insufficient documentation

## 2019-03-26 LAB — SARS CORONAVIRUS 2 (TAT 6-24 HRS): SARS Coronavirus 2: NEGATIVE

## 2019-03-27 ENCOUNTER — Encounter: Payer: Self-pay | Admitting: *Deleted

## 2019-03-27 ENCOUNTER — Other Ambulatory Visit: Payer: Managed Care, Other (non HMO)

## 2019-03-30 ENCOUNTER — Ambulatory Visit: Payer: Managed Care, Other (non HMO) | Admitting: Certified Registered"

## 2019-03-30 ENCOUNTER — Ambulatory Visit
Admission: RE | Admit: 2019-03-30 | Discharge: 2019-03-30 | Disposition: A | Discharge status: Home / Self Care | Payer: Managed Care, Other (non HMO) | Source: Ambulatory Visit | Attending: Internal Medicine | Admitting: Internal Medicine

## 2019-03-30 ENCOUNTER — Encounter: Payer: Self-pay | Admitting: *Deleted

## 2019-03-30 ENCOUNTER — Other Ambulatory Visit: Payer: Self-pay

## 2019-03-30 ENCOUNTER — Encounter
Admission: RE | Disposition: A | Discharge status: Home / Self Care | Payer: Self-pay | Source: Ambulatory Visit | Attending: Internal Medicine

## 2019-03-30 DIAGNOSIS — Z79899 Other long term (current) drug therapy: Secondary | ICD-10-CM | POA: Insufficient documentation

## 2019-03-30 DIAGNOSIS — D124 Benign neoplasm of descending colon: Secondary | ICD-10-CM | POA: Diagnosis not present

## 2019-03-30 DIAGNOSIS — K219 Gastro-esophageal reflux disease without esophagitis: Secondary | ICD-10-CM | POA: Insufficient documentation

## 2019-03-30 DIAGNOSIS — K640 First degree hemorrhoids: Secondary | ICD-10-CM | POA: Diagnosis not present

## 2019-03-30 DIAGNOSIS — I10 Essential (primary) hypertension: Secondary | ICD-10-CM | POA: Diagnosis not present

## 2019-03-30 DIAGNOSIS — I4891 Unspecified atrial fibrillation: Secondary | ICD-10-CM | POA: Diagnosis not present

## 2019-03-30 DIAGNOSIS — Z7982 Long term (current) use of aspirin: Secondary | ICD-10-CM | POA: Diagnosis not present

## 2019-03-30 DIAGNOSIS — K5730 Diverticulosis of large intestine without perforation or abscess without bleeding: Secondary | ICD-10-CM | POA: Diagnosis not present

## 2019-03-30 DIAGNOSIS — D125 Benign neoplasm of sigmoid colon: Secondary | ICD-10-CM | POA: Insufficient documentation

## 2019-03-30 DIAGNOSIS — I38 Endocarditis, valve unspecified: Secondary | ICD-10-CM | POA: Diagnosis not present

## 2019-03-30 DIAGNOSIS — K921 Melena: Secondary | ICD-10-CM | POA: Diagnosis present

## 2019-03-30 HISTORY — DX: Anal fissure, unspecified: K60.2

## 2019-03-30 HISTORY — DX: Endocarditis, valve unspecified: I38

## 2019-03-30 HISTORY — PX: COLONOSCOPY WITH PROPOFOL: SHX5780

## 2019-03-30 SURGERY — COLONOSCOPY WITH PROPOFOL
Anesthesia: General

## 2019-03-30 MED ORDER — PROPOFOL 10 MG/ML IV BOLUS
INTRAVENOUS | Status: DC | PRN
  Administered 2019-03-30: 50 mg via INTRAVENOUS
  Administered 2019-03-30: 30 mg via INTRAVENOUS
  Administered 2019-03-30: 50 mg via INTRAVENOUS
  Administered 2019-03-30 (×2): 30 mg via INTRAVENOUS
  Administered 2019-03-30 (×3): 50 mg via INTRAVENOUS
  Administered 2019-03-30 (×5): 30 mg via INTRAVENOUS

## 2019-03-30 MED ORDER — SODIUM CHLORIDE 0.9 % IV SOLN
INTRAVENOUS | Status: DC
  Administered 2019-03-30: 08:00:00 via INTRAVENOUS

## 2019-03-30 MED ORDER — EPHEDRINE SULFATE 50 MG/ML IJ SOLN
INTRAMUSCULAR | Status: DC | PRN
  Administered 2019-03-30 (×2): 10 mg via INTRAVENOUS

## 2019-03-30 NOTE — Anesthesia Post-op Follow-up Note (Signed)
Anesthesia QCDR form completed.        

## 2019-03-30 NOTE — Anesthesia Postprocedure Evaluation (Signed)
Anesthesia Post Note  Patient: Cody Caldwell  Procedure(s) Performed: COLONOSCOPY WITH PROPOFOL (N/A )  Patient location during evaluation: Endoscopy Anesthesia Type: General Level of consciousness: awake and alert Pain management: pain level controlled Vital Signs Assessment: post-procedure vital signs reviewed and stable Respiratory status: spontaneous breathing, nonlabored ventilation, respiratory function stable and patient connected to nasal cannula oxygen Cardiovascular status: blood pressure returned to baseline and stable Postop Assessment: no apparent nausea or vomiting Anesthetic complications: no     Last Vitals:  Vitals:   03/30/19 0807 03/30/19 0947  BP: 124/81 (!) 88/54  Pulse: (!) 48   Resp: 15   Temp: (!) 36.1 C (!) 36.1 C  SpO2: 99%     Last Pain:  Vitals:   03/30/19 0947  TempSrc: Tympanic  PainSc:                  Martha Clan

## 2019-03-30 NOTE — Transfer of Care (Signed)
Immediate Anesthesia Transfer of Care Note  Patient: Cody Caldwell  Procedure(s) Performed: COLONOSCOPY WITH PROPOFOL (N/A )  Patient Location: Endoscopy Unit  Anesthesia Type:General  Level of Consciousness: awake, drowsy and patient cooperative  Airway & Oxygen Therapy: Patient Spontanous Breathing and Patient connected to face mask oxygen  Post-op Assessment: Report given to RN and Post -op Vital signs reviewed and stable  Post vital signs: Reviewed and stable  Last Vitals:  Vitals Value Taken Time  BP 88/54 03/30/19 0948  Temp 36.1 C 03/30/19 0947  Pulse 60 03/30/19 0949  Resp 20 03/30/19 0949  SpO2 99 % 03/30/19 0949  Vitals shown include unvalidated device data.  Last Pain:  Vitals:   03/30/19 0947  TempSrc: Tympanic  PainSc:          Complications: No apparent anesthesia complications

## 2019-03-30 NOTE — Op Note (Signed)
[facility]astroenter[facility] Patient Name: Cody Caldwell Procedure Date: 03/30/2019 9:02 AM MRN: [id number] Account #: [id number] Date of Birth: October 01, 1960 Admit Type: Outpatient Age: 58 Room: [facility] ENDO ROOM 1 Gender: Male Note Status: Finalized Procedure:            Colonoscopy Indications:          Diarrhea (presumed secondary to ischemic colitis),                        Hematochezia Providers:            Benay Pike. Jaely Silman MD, MD Medicines:            Propofol per Anesthesia Complications:        No immediate complications. Procedure:            Pre-Anesthesia Assessment:                       - The risks and benefits of the procedure and the                        sedation options and risks were discussed with the                        patient. All questions were answered and informed                        consent was obtained.                       - Patient identification and proposed procedure were                        verified prior to the procedure by the nurse. The                        procedure was verified in the procedure room.                       - ASA Grade Assessment: III - A patient with severe                        systemic disease.                       - After reviewing the risks and benefits, the patient                        was deemed in satisfactory condition to undergo the                        procedure.                       After obtaining informed consent, the colonoscope was                        passed under direct vision. Throughout the procedure,                        the patient's blood pressure, pulse, and oxygen  saturations were monitored continuously. The                        Colonoscope was introduced through the anus and                        advanced to the the cecum, identified by appendiceal                        orifice and ileocecal valve. The colonoscopy was                         performed without difficulty. The patient tolerated the                        procedure well. The quality of the bowel preparation                        was good. Findings:      The perianal and digital rectal examinations were normal. Pertinent       negatives include normal sphincter tone and no palpable rectal lesions.      Many small-mouthed diverticula were found in the entire colon.      A 11 mm polyp was found in the sigmoid colon. The polyp was       semi-pedunculated. The polyp was removed with a cold snare. Resection       and retrieval were complete. To prevent bleeding after the polypectomy,       one hemostatic clip was successfully placed (MR conditional). There was       no bleeding at the end of the procedure.      A 10 mm polyp was found in the descending colon. The polyp was       filamentous and pedunculated. The polyp was removed with a cold snare.       Resection and retrieval were complete. To prevent bleeding after the       polypectomy, two hemostatic clips were successfully placed (MR       conditional). There was no bleeding at the end of the procedure. One       clip had inadvertently self detached and was removed by scope. The other       clip was in place and stable.      Non-bleeding internal hemorrhoids were found during retroflexion. The       hemorrhoids were Grade I (internal hemorrhoids that do not prolapse).      The exam was otherwise without abnormality. Impression:           - Diverticulosis in the entire examined colon.                       - One 11 mm polyp in the sigmoid colon, removed with a                        cold snare. Resected and retrieved. Clip (MR                        conditional) was placed.                       - One 10 mm polyp in the descending colon,  removed with                        a cold snare. Resected and retrieved. Clips (MR                        conditional) were placed.                       - Non-bleeding  internal hemorrhoids.                       - The examination was otherwise normal. Recommendation:       - Patient has a contact number available for                        emergencies. The signs and symptoms of potential                        delayed complications were discussed with the patient.                        Return to normal activities tomorrow. Written discharge                        instructions were provided to the patient.                       - Resume previous diet.                       - Continue present medications.                       - Await pathology results.                       - Repeat colonoscopy is recommended for surveillance.                        The colonoscopy date will be determined after pathology                        results from today's exam become available for review. Procedure Code(s):    --- Professional ---                       [phone], Colonoscopy, flexible; with removal of tumor(s),                        polyp(s), or other lesion(s) by snare technique Diagnosis Code(s):    --- Professional ---                       K57.30, Diverticulosis of large intestine without                        perforation or abscess without bleeding                       K92.1, Melena (includes Hematochezia)                       R19.7, Diarrhea, unspecified  K63.5, Polyp of colon                       K64.0, First degree hemorrhoids CPT copyright 2019 American Medical Association. All rights reserved. The codes documented in this report are preliminary and upon coder review may  be revised to meet current compliance requirements. Efrain Sella MD, MD 03/30/2019 9:53:39 AM This report has been signed electronically. Number of Addenda: 0 Note Initiated On: 03/30/2019 9:02 AM Scope Withdrawal Time: 0 hours 14 minutes 56 seconds  Total Procedure Duration: 0 hours 26 minutes 43 seconds  Estimated Blood Loss: Estimated blood loss:  none.      [facility]

## 2019-03-30 NOTE — H&P (Signed)
Outpatient short stay form Pre-procedure 03/30/2019 9:10 AM  K. Alice Reichert, M.D.  Primary Physician: Fulton Reek, MD  Reason for visit: Rectal bleeding, diarrhea, abnormal CT of the gastrointestinal tract.  History of present illness: 58 year old male presents for a week and a half of intermittent diarrhea with hematochezia.  Patient denies any symptoms of fever weight loss or abdominal pain.  No upper GI symptoms.  Recent CT scan of the abdomen and pelvis revealed concentric thickening of the rectum and recommendation was made for colonoscopy to rule out colorectal neoplasm.    Current Facility-Administered Medications:  .  0.9 %  sodium chloride infusion, , Intravenous, Continuous, [place], Benay Pike, MD, Last Rate: 20 mL/hr at 03/30/19 0820  Medications Prior to Admission  Medication Sig Dispense Refill Last Dose  . aspirin 325 MG tablet Take 325 mg by mouth daily.    03/29/2019 at 0600  . lisinopril (PRINIVIL,ZESTRIL) 10 MG tablet Take 10 mg by mouth daily.   03/30/2019 at 0500  . Multiple Vitamins-Minerals (MULTIVITAMIN WITH MINERALS) tablet Take 1 tablet by mouth daily.   Past Week at Unknown time  . omeprazole (PRILOSEC) 20 MG capsule Take 20 mg by mouth daily.   03/29/2019 at 1800  . propafenone (RYTHMOL SR) 225 MG 12 hr capsule Take 225 mg by mouth 2 (two) times daily.   03/30/2019 at 0500  . tadalafil (CIALIS) 20 MG tablet Take 20 mg by mouth every three (3) days as needed for erectile dysfunction.   Past Week at Unknown time  . albuterol (PROVENTIL HFA;VENTOLIN HFA) 108 (90 Base) MCG/ACT inhaler Inhale 1-2 puffs into the lungs every 6 (six) hours as needed for wheezing or shortness of breath. 1 Inhaler 0   . benzonatate (TESSALON) 200 MG capsule Take 1 capsule (200 mg total) by mouth 3 (three) times daily as needed for cough. (Patient not taking: Reported on 03/27/2019) 30 capsule 0 Not Taking at Unknown time  . chlorpheniramine-HYDROcodone (TUSSIONEX PENNKINETIC ER) 10-8 MG/5ML SUER  Take 5 mLs by mouth every 12 (twelve) hours as needed for cough. (Patient not taking: Reported on 03/27/2019) 120 mL 0 Not Taking at Unknown time  . fluticasone (FLONASE) 50 MCG/ACT nasal spray Place 2 sprays into both nostrils daily. (Patient not taking: Reported on 03/27/2019) 16 g 0 Not Taking at Unknown time  . Spacer/Aero-Holding Chambers (AEROCHAMBER PLUS) inhaler Use as instructed 1 each 2      No Known Allergies   Past Medical History:  Diagnosis Date  . Atrial fibrillation ([place])   . GERD (gastroesophageal reflux disease)   . Hyperlipidemia   . Hypertension   . Pneumonia 06/2015  . Rectal fissure   . Valvular heart disease   . VHD (valvular heart disease)     Review of systems:  Otherwise negative.    Physical Exam  Gen: Alert, oriented. Appears stated age.  HEENT: [place]/AT. PERRLA. Lungs: CTA, no wheezes. CV: RR nl S1, S2. Abd: soft, benign, no masses. BS+ Ext: No edema. Pulses 2+    Planned procedures: Proceed with colonoscopy. The patient understands the nature of the planned procedure, indications, risks, alternatives and potential complications including but not limited to bleeding, infection, perforation, damage to internal organs and possible oversedation/side effects from anesthesia. The patient agrees and gives consent to proceed.  Please refer to procedure notes for findings, recommendations and patient disposition/instructions.      K. Alice Reichert, M.D. Gastroenterology 03/30/2019  9:10 AM

## 2019-03-30 NOTE — Interval H&P Note (Signed)
History and Physical Interval Note:  03/30/2019 9:12 AM  Cody Caldwell  has presented today for surgery, with the diagnosis of RECTAL BLEED  DIARRHEA.  The various methods of treatment have been discussed with the patient and family. After consideration of risks, benefits and other options for treatment, the patient has consented to  Procedure(s): COLONOSCOPY WITH PROPOFOL (N/A) as a surgical intervention.  The patient's history has been reviewed, patient examined, no change in status, stable for surgery.  I have reviewed the patient's chart and labs.  Questions were answered to the patient's satisfaction.     [place], [place]

## 2019-03-30 NOTE — Anesthesia Preprocedure Evaluation (Signed)
Anesthesia Evaluation  Patient identified by MRN, date of birth, ID band Patient awake    Reviewed: Allergy & Precautions, H&P , NPO status , Patient's Chart, lab work & pertinent test results, reviewed documented beta blocker date and time   Airway Mallampati: II   Neck ROM: full    Dental  (+) Poor Dentition, Teeth Intact   Pulmonary neg pulmonary ROS, pneumonia, resolved,    Pulmonary exam normal        Cardiovascular hypertension, (-) angina(-) Past MI and (-) Cardiac Stents + dysrhythmias Atrial Fibrillation + Valvular Problems/Murmurs      Neuro/Psych negative neurological ROS  negative psych ROS   GI/Hepatic Neg liver ROS, GERD  ,  Endo/Other  negative endocrine ROS  Renal/GU negative Renal ROS  negative genitourinary   Musculoskeletal   Abdominal   Peds  Hematology negative hematology ROS (+)   Anesthesia Other Findings Past Medical History:   GERD (gastroesophageal reflux disease)                       Hypertension                                                 Atrial fibrillation (HCC)                                    Hyperlipidemia                                               VHD (valvular heart disease)                                 Pneumonia                                       06/2015    Past Surgical History:   KNEE SURGERY                                    Right              COLONOSCOPY                                                   ESOPHAGOGASTRODUODENOSCOPY                                  BMI    Body Mass Index   32.99 kg/m 2     Reproductive/Obstetrics negative OB ROS                             Anesthesia Physical  Anesthesia Plan  ASA: III  Anesthesia Plan: General  Post-op Pain Management:    Induction: Intravenous  PONV Risk Score and Plan: 2 and Propofol infusion and TIVA  Airway Management Planned: Natural Airway and Nasal  Cannula  Additional Equipment:   Intra-op Plan:   Post-operative Plan:   Informed Consent: I have reviewed the patients History and Physical, chart, labs and discussed the procedure including the risks, benefits and alternatives for the proposed anesthesia with the patient or authorized representative who has indicated his/her understanding and acceptance.     Dental Advisory Given  Plan Discussed with: CRNA  Anesthesia Plan Comments:         Anesthesia Quick Evaluation

## 2019-03-31 ENCOUNTER — Encounter: Payer: Self-pay | Admitting: Internal Medicine

## 2019-04-02 LAB — SURGICAL PATHOLOGY

## 2019-09-09 ENCOUNTER — Other Ambulatory Visit (HOSPITAL_COMMUNITY): Payer: Self-pay | Admitting: Podiatry

## 2019-09-09 ENCOUNTER — Other Ambulatory Visit: Payer: Self-pay | Admitting: Podiatry

## 2019-09-09 DIAGNOSIS — M7662 Achilles tendinitis, left leg: Secondary | ICD-10-CM

## 2019-09-14 ENCOUNTER — Ambulatory Visit
Admission: RE | Admit: 2019-09-14 | Discharge: 2019-09-14 | Disposition: A | Discharge status: Home / Self Care | Payer: Managed Care, Other (non HMO) | Source: Ambulatory Visit | Attending: Podiatry | Admitting: Podiatry

## 2019-09-14 ENCOUNTER — Other Ambulatory Visit: Payer: Self-pay

## 2019-09-14 DIAGNOSIS — M7662 Achilles tendinitis, left leg: Secondary | ICD-10-CM | POA: Insufficient documentation

## 2019-09-17 ENCOUNTER — Other Ambulatory Visit: Payer: Self-pay | Admitting: Podiatry

## 2019-09-22 ENCOUNTER — Encounter
Admission: RE | Admit: 2019-09-22 | Discharge: 2019-09-22 | Disposition: A | Discharge status: Home / Self Care | Payer: Managed Care, Other (non HMO) | Source: Ambulatory Visit | Attending: Podiatry | Admitting: Podiatry

## 2019-09-22 ENCOUNTER — Other Ambulatory Visit: Payer: Self-pay

## 2019-09-22 DIAGNOSIS — Z01812 Encounter for preprocedural laboratory examination: Secondary | ICD-10-CM | POA: Insufficient documentation

## 2019-09-22 HISTORY — DX: Other hemorrhoids: K64.8

## 2019-09-22 HISTORY — DX: Diverticulosis of intestine, part unspecified, without perforation or abscess without bleeding: K57.90

## 2019-09-22 HISTORY — DX: Cardiac arrhythmia, unspecified: I49.9

## 2019-09-22 NOTE — Patient Instructions (Signed)
Your procedure is scheduled on: Friday 2/5 Report to Day Surgery. [facility] To find out your arrival time please call [phone] between 1PM - 3PM on Thurs 2/4.  Remember: Instructions that are not followed completely may result in serious medical risk,  up to and including death, or upon the discretion of your surgeon and anesthesiologist your  surgery may need to be rescheduled.     _X__ 1. Do not eat food after midnight the night before your procedure.                 No gum chewing or hard candies. You may drink clear liquids up to 2 hours                 before you are scheduled to arrive for your surgery- DO not drink clear                 liquids within 2 hours of the start of your surgery.                 Clear Liquids include:  water, apple juice without pulp, clear carbohydrate                 drink such as Clearfast of Gatorade, Black Coffee or Tea (Do not add                 anything to coffee or tea).  __X__2.  On the morning of surgery brush your teeth with toothpaste and water, you                may rinse your mouth with mouthwash if you wish.  Do not swallow any toothpaste of mouthwash.     _X__ 3.  No Alcohol for 24 hours before or after surgery.   ___ 4.  Do Not Smoke or use e-cigarettes For 24 Hours Prior to Your Surgery.                 Do not use any chewable tobacco products for at least 6 hours prior to                 surgery.  ____  5.  Bring all medications with you on the day of surgery if instructed.   _x___  6.  Notify your doctor if there is any change in your medical condition      (cold, fever, infections).     Do not wear jewelry, make-up, hairpins, clips or nail polish. Do not wear lotions, powders, or perfumes. You may wear deodorant. Do not shave 48 hours prior to surgery. Men may shave face and neck. Do not bring valuables to the hospital.    [facility] is not responsible for any belongings or  valuables.  Contacts, dentures or bridgework may not be worn into surgery. Leave your suitcase in the car. After surgery it may be brought to your room. For patients admitted to the hospital, discharge time is determined by your treatment team.   Patients discharged the day of surgery will not be allowed to drive home.   Please read over the following fact sheets that you were given:     __x__ Take these medicines the morning of surgery with A SIP OF WATER:    1. omeprazole (PRILOSEC) 20 MG capsule   Dose the night before and one the morning of surgery  2. propafenone (RYTHMOL SR) 225 MG 12 hr capsule  3.   4.  5.  6.  ____ Fleet Enema (as directed)   _x___ Use CHG Soap as directed  ____ Use inhalers on the day of surgery  ____ Stop metformin 2 days prior to surgery    ____ Take 1/2 of usual insulin dose the night before surgery. No insulin the morning          of surgery.   _x___ Stopped aspirin on 1/28  __x__ Stopped Aleve already.     No ibuprofen, motrin or advil or diclofenac Sodium (VOLTAREN) 1 % GEL either    May take tylenol   __x__ Stop supplements until after surgery.Omega-3 Fatty Acids (OMEGA 3 PO ,balance otc supplement,Specialty Vitamins Products (ULTIMATE FAT BURNER) TABS   ____ Bring C-Pap to the hospital.

## 2019-09-23 ENCOUNTER — Other Ambulatory Visit
Admission: RE | Admit: 2019-09-23 | Discharge: 2019-09-23 | Disposition: A | Discharge status: Home / Self Care | Payer: Managed Care, Other (non HMO) | Source: Ambulatory Visit | Attending: Podiatry | Admitting: Podiatry

## 2019-09-23 DIAGNOSIS — Z01812 Encounter for preprocedural laboratory examination: Secondary | ICD-10-CM | POA: Insufficient documentation

## 2019-09-23 DIAGNOSIS — Z20822 Contact with and (suspected) exposure to covid-19: Secondary | ICD-10-CM | POA: Insufficient documentation

## 2019-09-23 LAB — BASIC METABOLIC PANEL
Anion gap: 10 (ref 5–15)
BUN: 32 mg/dL — ABNORMAL HIGH (ref 6–20)
CO2: 25 mmol/L (ref 22–32)
Calcium: 9.4 mg/dL (ref 8.9–10.3)
Chloride: 105 mmol/L (ref 98–111)
Creatinine, Ser: 0.89 mg/dL (ref 0.61–1.24)
GFR calc Af Amer: >60 mL/min (ref >60)
GFR calc non Af Amer: >60 mL/min (ref >60)
Glucose, Bld: 99 mg/dL (ref 70–99)
Potassium: 4.1 mmol/L (ref 3.5–5.1)
Sodium: 140 mmol/L (ref 135–145)

## 2019-09-23 LAB — CBC
HCT: 45.2 % (ref 39.0–52.0)
Hemoglobin: 15.3 g/dL (ref 13.0–17.0)
MCH: 30.4 pg (ref 26.0–34.0)
MCHC: 33.8 g/dL (ref 30.0–36.0)
MCV: 89.9 fL (ref 80.0–100.0)
Platelets: 224 10*3/uL (ref 150–400)
RBC: 5.03 MIL/uL (ref 4.22–5.81)
RDW: 13.7 % (ref 11.5–15.5)
WBC: 5.3 10*3/uL (ref 4.0–10.5)
nRBC: 0 % (ref 0.0–0.2)

## 2019-09-23 LAB — SARS CORONAVIRUS 2 (TAT 6-24 HRS): SARS Coronavirus 2: NEGATIVE

## 2019-09-25 ENCOUNTER — Other Ambulatory Visit: Payer: Self-pay

## 2019-09-25 ENCOUNTER — Encounter: Payer: Self-pay | Admitting: Podiatry

## 2019-09-25 ENCOUNTER — Ambulatory Visit: Payer: Managed Care, Other (non HMO) | Admitting: Registered Nurse

## 2019-09-25 ENCOUNTER — Ambulatory Visit
Admission: RE | Admit: 2019-09-25 | Discharge: 2019-09-25 | Disposition: A | Discharge status: Home / Self Care | Payer: Managed Care, Other (non HMO) | Source: Ambulatory Visit | Attending: Podiatry | Admitting: Podiatry

## 2019-09-25 ENCOUNTER — Encounter
Admission: RE | Disposition: A | Discharge status: Home / Self Care | Payer: Self-pay | Source: Ambulatory Visit | Attending: Podiatry

## 2019-09-25 DIAGNOSIS — I10 Essential (primary) hypertension: Secondary | ICD-10-CM | POA: Insufficient documentation

## 2019-09-25 DIAGNOSIS — E785 Hyperlipidemia, unspecified: Secondary | ICD-10-CM | POA: Insufficient documentation

## 2019-09-25 DIAGNOSIS — M7662 Achilles tendinitis, left leg: Secondary | ICD-10-CM | POA: Insufficient documentation

## 2019-09-25 DIAGNOSIS — K219 Gastro-esophageal reflux disease without esophagitis: Secondary | ICD-10-CM | POA: Insufficient documentation

## 2019-09-25 DIAGNOSIS — I4891 Unspecified atrial fibrillation: Secondary | ICD-10-CM | POA: Insufficient documentation

## 2019-09-25 HISTORY — PX: ACHILLES TENDON SURGERY: SHX542

## 2019-09-25 HISTORY — PX: GASTROC RECESSION EXTREMITY: SHX6262

## 2019-09-25 LAB — SYNOVIAL FLUID, CRYSTAL

## 2019-09-25 SURGERY — RECESSION, TENDON, GASTROCNEMIUS
Anesthesia: General | Laterality: Left

## 2019-09-25 MED ORDER — ONDANSETRON HCL 4 MG/2ML IJ SOLN
INTRAMUSCULAR | Status: DC | PRN
  Administered 2019-09-25: 4 mg via INTRAVENOUS

## 2019-09-25 MED ORDER — OXYCODONE-ACETAMINOPHEN 5-325 MG PO TABS
ORAL_TABLET | ORAL | Status: AC
  Administered 2019-09-25: 1 via ORAL
  Filled 2019-09-25: qty 1

## 2019-09-25 MED ORDER — OXYCODONE-ACETAMINOPHEN 5-325 MG PO TABS: 1.0000 | ORAL_TABLET | Freq: Four times a day (QID) | ORAL | 0 refills | Status: AC | PRN

## 2019-09-25 MED ORDER — MIDAZOLAM HCL 2 MG/2ML IJ SOLN
INTRAMUSCULAR | Status: AC
  Filled 2019-09-25: qty 2

## 2019-09-25 MED ORDER — LIDOCAINE HCL (PF) 2 % IJ SOLN
INTRAMUSCULAR | Status: AC
  Filled 2019-09-25: qty 10

## 2019-09-25 MED ORDER — CEFAZOLIN SODIUM-DEXTROSE 2-4 GM/100ML-% IV SOLN
2.0000 g | INTRAVENOUS | Status: AC
  Administered 2019-09-25: 2 g via INTRAVENOUS

## 2019-09-25 MED ORDER — BUPIVACAINE LIPOSOME 1.3 % IJ SUSP
INTRAMUSCULAR | Status: DC | PRN
  Administered 2019-09-25: 10 mL

## 2019-09-25 MED ORDER — BUPIVACAINE HCL (PF) 0.25 % IJ SOLN
INTRAMUSCULAR | Status: AC
  Filled 2019-09-25: qty 30

## 2019-09-25 MED ORDER — FENTANYL CITRATE (PF) 100 MCG/2ML IJ SOLN
INTRAMUSCULAR | Status: AC
  Administered 2019-09-25: 25 ug via INTRAVENOUS
  Filled 2019-09-25: qty 2

## 2019-09-25 MED ORDER — POVIDONE-IODINE 7.5 % EX SOLN
Freq: Once | CUTANEOUS | Status: DC
  Filled 2019-09-25: qty 118

## 2019-09-25 MED ORDER — LACTATED RINGERS IV SOLN: INTRAVENOUS | Status: DC

## 2019-09-25 MED ORDER — BUPIVACAINE HCL (PF) 0.25 % IJ SOLN
INTRAMUSCULAR | Status: DC | PRN
  Administered 2019-09-25: 10 mL

## 2019-09-25 MED ORDER — LIDOCAINE HCL (PF) 1 % IJ SOLN
INTRAMUSCULAR | Status: AC
  Filled 2019-09-25: qty 30

## 2019-09-25 MED ORDER — MIDAZOLAM HCL 2 MG/2ML IJ SOLN
INTRAMUSCULAR | Status: DC | PRN
  Administered 2019-09-25: 2 mg via INTRAVENOUS

## 2019-09-25 MED ORDER — BUPIVACAINE LIPOSOME 1.3 % IJ SUSP
INTRAMUSCULAR | Status: AC
  Filled 2019-09-25: qty 20

## 2019-09-25 MED ORDER — PROMETHAZINE HCL 25 MG/ML IJ SOLN: 6.2500 mg | INTRAMUSCULAR | Status: DC | PRN

## 2019-09-25 MED ORDER — OXYCODONE-ACETAMINOPHEN 5-325 MG PO TABS: 1.0000 | ORAL_TABLET | Freq: Once | ORAL | Status: AC

## 2019-09-25 MED ORDER — ONDANSETRON HCL 4 MG PO TABS: 4.0000 mg | ORAL_TABLET | Freq: Four times a day (QID) | ORAL | Status: DC | PRN

## 2019-09-25 MED ORDER — ROCURONIUM BROMIDE 100 MG/10ML IV SOLN
INTRAVENOUS | Status: DC | PRN
  Administered 2019-09-25: 10 mg via INTRAVENOUS
  Administered 2019-09-25: 50 mg via INTRAVENOUS
  Administered 2019-09-25: 10 mg via INTRAVENOUS
  Administered 2019-09-25: 20 mg via INTRAVENOUS

## 2019-09-25 MED ORDER — ONDANSETRON HCL 4 MG/2ML IJ SOLN
INTRAMUSCULAR | Status: AC
  Filled 2019-09-25: qty 2

## 2019-09-25 MED ORDER — SUGAMMADEX SODIUM 200 MG/2ML IV SOLN
INTRAVENOUS | Status: AC
  Filled 2019-09-25: qty 2

## 2019-09-25 MED ORDER — LIDOCAINE HCL (CARDIAC) PF 100 MG/5ML IV SOSY
PREFILLED_SYRINGE | INTRAVENOUS | Status: DC | PRN
  Administered 2019-09-25: 100 mg via INTRAVENOUS

## 2019-09-25 MED ORDER — SUGAMMADEX SODIUM 200 MG/2ML IV SOLN
INTRAVENOUS | Status: DC | PRN
  Administered 2019-09-25: 200 mg via INTRAVENOUS

## 2019-09-25 MED ORDER — PROPOFOL 10 MG/ML IV BOLUS
INTRAVENOUS | Status: AC
  Filled 2019-09-25: qty 20

## 2019-09-25 MED ORDER — CEFAZOLIN SODIUM-DEXTROSE 2-4 GM/100ML-% IV SOLN
INTRAVENOUS | Status: AC
  Filled 2019-09-25: qty 100

## 2019-09-25 MED ORDER — SEVOFLURANE IN SOLN
RESPIRATORY_TRACT | Status: AC
  Filled 2019-09-25: qty 250

## 2019-09-25 MED ORDER — EPHEDRINE SULFATE 50 MG/ML IJ SOLN
INTRAMUSCULAR | Status: DC | PRN
  Administered 2019-09-25 (×3): 5 mg via INTRAVENOUS

## 2019-09-25 MED ORDER — FENTANYL CITRATE (PF) 100 MCG/2ML IJ SOLN: 25.0000 ug | INTRAMUSCULAR | Status: DC | PRN

## 2019-09-25 MED ORDER — BUPIVACAINE HCL (PF) 0.5 % IJ SOLN
INTRAMUSCULAR | Status: AC
  Filled 2019-09-25: qty 30

## 2019-09-25 MED ORDER — ROCURONIUM BROMIDE 50 MG/5ML IV SOLN
INTRAVENOUS | Status: AC
  Filled 2019-09-25: qty 1

## 2019-09-25 MED ORDER — ONDANSETRON HCL 4 MG/2ML IJ SOLN: 4.0000 mg | Freq: Four times a day (QID) | INTRAMUSCULAR | Status: DC | PRN

## 2019-09-25 MED ORDER — SODIUM CHLORIDE 0.9 % IV SOLN
INTRAVENOUS | Status: DC | PRN
  Administered 2019-09-25: 40 ug/min via INTRAVENOUS

## 2019-09-25 MED ORDER — DEXAMETHASONE SODIUM PHOSPHATE 10 MG/ML IJ SOLN
INTRAMUSCULAR | Status: AC
  Filled 2019-09-25: qty 1

## 2019-09-25 MED ORDER — DEXAMETHASONE SODIUM PHOSPHATE 10 MG/ML IJ SOLN
INTRAMUSCULAR | Status: DC | PRN
  Administered 2019-09-25: 10 mg via INTRAVENOUS

## 2019-09-25 MED ORDER — FENTANYL CITRATE (PF) 100 MCG/2ML IJ SOLN
INTRAMUSCULAR | Status: AC
  Filled 2019-09-25: qty 2

## 2019-09-25 MED ORDER — FENTANYL CITRATE (PF) 100 MCG/2ML IJ SOLN
INTRAMUSCULAR | Status: DC | PRN
  Administered 2019-09-25: 100 ug via INTRAVENOUS

## 2019-09-25 MED ORDER — PROPOFOL 10 MG/ML IV BOLUS
INTRAVENOUS | Status: DC | PRN
  Administered 2019-09-25: 160 mg via INTRAVENOUS

## 2019-09-25 MED ORDER — LIDOCAINE HCL (PF) 1 % IJ SOLN: INTRAMUSCULAR | Status: DC | PRN

## 2019-09-25 SURGICAL SUPPLY — 66 items
BLADE SURG 15 STRL LF DISP TIS (BLADE) ×6 IMPLANT
BLADE SURG 15 STRL SS (BLADE) ×6
BLADE SURG MINI STRL (BLADE) ×4 IMPLANT
BNDG CONFORM 2 STRL LF (GAUZE/BANDAGES/DRESSINGS) ×2 IMPLANT
BNDG CONFORM 3 STRL LF (GAUZE/BANDAGES/DRESSINGS) ×2 IMPLANT
BNDG ELASTIC 4X5.8 VLCR NS LF (GAUZE/BANDAGES/DRESSINGS) ×4 IMPLANT
BNDG ESMARK 4X12 TAN STRL LF (GAUZE/BANDAGES/DRESSINGS) ×2 IMPLANT
BNDG ESMARK 6X12 TAN STRL LF (GAUZE/BANDAGES/DRESSINGS) ×2 IMPLANT
CANISTER SUCT 1200ML W/VALVE (MISCELLANEOUS) ×2 IMPLANT
CNTNR SPEC 2.5X3XGRAD LEK (MISCELLANEOUS) ×1
CONT SPEC 4OZ STER OR WHT (MISCELLANEOUS) ×1
CONTAINER SPEC 2.5X3XGRAD LEK (MISCELLANEOUS) ×1 IMPLANT
COVER WAND RF STERILE (DRAPES) ×2 IMPLANT
CUFF TOURN SGL QUICK 24 (TOURNIQUET CUFF) ×1
CUFF TRNQT CYL 24X4X16.5-23 (TOURNIQUET CUFF) ×1 IMPLANT
DRAPE FLUOR MINI C-ARM 54X84 (DRAPES) ×2 IMPLANT
DURAPREP 26ML APPLICATOR (WOUND CARE) ×2 IMPLANT
ELECT REM PT RETURN 9FT ADLT (ELECTROSURGICAL) ×2
ELECTRODE REM PT RTRN 9FT ADLT (ELECTROSURGICAL) ×1 IMPLANT
GAUZE SPONGE 4X4 12PLY STRL (GAUZE/BANDAGES/DRESSINGS) ×2 IMPLANT
GAUZE XEROFORM 1X8 LF (GAUZE/BANDAGES/DRESSINGS) ×2 IMPLANT
GLOVE BIO SURGEON STRL SZ7.5 (GLOVE) ×2 IMPLANT
GLOVE BIO SURGEON STRL SZ8 (GLOVE) ×10 IMPLANT
GLOVE INDICATOR 8.0 STRL GRN (GLOVE) ×6 IMPLANT
GOWN STRL REUS W/ TWL LRG LVL3 (GOWN DISPOSABLE) ×2 IMPLANT
GOWN STRL REUS W/TWL LRG LVL3 (GOWN DISPOSABLE) ×2
HANDLE YANKAUER SUCT BULB TIP (MISCELLANEOUS) ×2 IMPLANT
KIT TURNOVER KIT A (KITS) ×2 IMPLANT
LABEL OR SOLS (LABEL) ×2 IMPLANT
NDL MAYO CATGUT SZ5 (NEEDLE) ×1
NDL SAFETY ECLIPSE 18X1.5 (NEEDLE) ×1 IMPLANT
NDL SUT 5 .5 CRC TPR PNT MAYO (NEEDLE) ×1 IMPLANT
NEEDLE FILTER BLUNT 18X 1/2SAF (NEEDLE) ×1
NEEDLE FILTER BLUNT 18X1 1/2 (NEEDLE) ×1 IMPLANT
NEEDLE HYPO 18GX1.5 SHARP (NEEDLE) ×1
NEEDLE HYPO 25X1 1.5 SAFETY (NEEDLE) ×6 IMPLANT
NS IRRIG 500ML POUR BTL (IV SOLUTION) ×2 IMPLANT
PACK EXTREMITY ARMC (MISCELLANEOUS) ×2 IMPLANT
PAD CAST CTTN 4X4 STRL (SOFTGOODS) IMPLANT
PADDING CAST COTTON 4X4 STRL (SOFTGOODS)
RASP SM TEAR CROSS CUT (RASP) ×2 IMPLANT
SPLINT CAST 1 STEP 5X30 WHT (MISCELLANEOUS) ×2 IMPLANT
SPLINT FAST PLASTER 5X30 (CAST SUPPLIES) ×1
SPLINT PLASTER CAST FAST 5X30 (CAST SUPPLIES) ×1 IMPLANT
SPONGE LAP 18X18 RF (DISPOSABLE) ×2 IMPLANT
STOCKINETTE M/LG 89821 (MISCELLANEOUS) ×2 IMPLANT
STRIP CLOSURE SKIN 1/2X4 (GAUZE/BANDAGES/DRESSINGS) ×2 IMPLANT
SUT MNCRL 4-0 (SUTURE) ×1
SUT MNCRL 4-0 27XMFL (SUTURE) ×1
SUT MNCRL AB 4-0 PS2 18 (SUTURE) ×2 IMPLANT
SUT MNCRL+ 5-0 VIOLET P-3 (SUTURE) ×1 IMPLANT
SUT MONOCRYL 5-0 (SUTURE) ×1
SUT PDS AB 0 CT1 27 (SUTURE) IMPLANT
SUT VIC AB 0 SH 27 (SUTURE) IMPLANT
SUT VIC AB 2-0 SH 27 (SUTURE)
SUT VIC AB 2-0 SH 27XBRD (SUTURE) IMPLANT
SUT VIC AB 3-0 SH 27 (SUTURE) ×3
SUT VIC AB 3-0 SH 27X BRD (SUTURE) ×3 IMPLANT
SUT VIC AB 4-0 FS2 27 (SUTURE) IMPLANT
SUT VICRYL AB 3-0 FS1 BRD 27IN (SUTURE) IMPLANT
SUTURE MNCRL 4-0 27XMF (SUTURE) ×1 IMPLANT
SWABSTK COMLB BENZOIN TINCTURE (MISCELLANEOUS) IMPLANT
SYR 10ML LL (SYRINGE) ×4 IMPLANT
SYR 3ML LL SCALE MARK (SYRINGE) ×2 IMPLANT
WAND TOPAZ MICRO DEBRIDER (MISCELLANEOUS) ×2 IMPLANT
WIRE MAGNUM (SUTURE) IMPLANT

## 2019-09-25 NOTE — Anesthesia Preprocedure Evaluation (Signed)
Anesthesia Evaluation  Patient identified by MRN, date of birth, ID band Patient awake    Reviewed: Allergy & Precautions, H&P , NPO status , Patient's Chart, lab work & pertinent test results, reviewed documented beta blocker date and time   History of Anesthesia Complications Negative for: history of anesthetic complications  Airway Mallampati: II   Neck ROM: full    Dental  (+) Poor Dentition, Teeth Intact, Dental Advidsory Given   Pulmonary neg pulmonary ROS,    Pulmonary exam normal        Cardiovascular hypertension, (-) angina(-) Past MI and (-) Cardiac Stents + dysrhythmias Atrial Fibrillation + Valvular Problems/Murmurs      Neuro/Psych negative neurological ROS  negative psych ROS   GI/Hepatic Neg liver ROS, GERD  ,  Endo/Other  negative endocrine ROS  Renal/GU negative Renal ROS  negative genitourinary   Musculoskeletal   Abdominal   Peds  Hematology negative hematology ROS (+)   Anesthesia Other Findings Past Medical History:   GERD (gastroesophageal reflux disease)                       Hypertension                                                 Atrial fibrillation (HCC)                                    Hyperlipidemia                                               VHD (valvular heart disease)                                 Pneumonia                                       06/2015    Past Surgical History:   KNEE SURGERY                                    Right              COLONOSCOPY                                                   ESOPHAGOGASTRODUODENOSCOPY                                  BMI    Body Mass Index   32.99 kg/m 2     Reproductive/Obstetrics negative OB ROS  Anesthesia Physical  Anesthesia Plan  ASA: II  Anesthesia Plan: General   Post-op Pain Management:    Induction: Intravenous  PONV Risk Score and Plan: 2 and  Ondansetron, Dexamethasone, Midazolam and Treatment may vary due to age or medical condition  Airway Management Planned: Oral ETT  Additional Equipment:   Intra-op Plan:   Post-operative Plan: Extubation in OR  Informed Consent: I have reviewed the patients History and Physical, chart, labs and discussed the procedure including the risks, benefits and alternatives for the proposed anesthesia with the patient or authorized representative who has indicated his/her understanding and acceptance.     Dental Advisory Given  Plan Discussed with: CRNA  Anesthesia Plan Comments:         Anesthesia Quick Evaluation

## 2019-09-25 NOTE — Op Note (Signed)
Operative note   Surgeon:Anyla Israelson Lawyer: Dr. Zachery Dauer    Preop diagnosis: 1.  Gastroc equinus deformity left lower leg 2.  Chronic Achilles tendinitis left leg    Postop diagnosis: Same    Procedure: 1.  Strayer gastroc recession left lower leg 2.  Achilles tendon repair and debridement left Achilles tendon    EBL: Minimal    Anesthesia:local and general.  Local consisted of 10 mL of 0.25% bupivacaine and 20 mL of Exparel long-acting anesthetic.    Hemostasis: Thigh tourniquet inflated to 250 mmHg for approximately 80 minutes    Specimen: 1.  Degenerative Achilles tendon 2.  Intratendinous gout crystals    Complications: None    Operative indications:Cody Caldwell is an 59 y.o. that presents today for surgical intervention.  The risks/benefits/alternatives/complications have been discussed and consent has been given.    Procedure:  Patient was brought into the OR and placed on the operating table in theprone position. After anesthesia was obtained theleft lower extremity was prepped and draped in usual sterile fashion.  Attention was directed to the myotendinous junction region on the posterior calf.  A longitudinal incision was performed.  Sharp and blunt dissection carried down to the peritenon.  The peritenon was then incised.  Care was taken to retract the sural nerve and saphenous vein which were noted and retracted throughout the entire procedure.  Next a horizontal Strayer gastroc recession was performed.  The underlying muscle belly was noted.  Good excursion was noted at this time.  The wound was flushed with copious amounts of irrigation.  Closure was then performed with a 3-0 Vicryl for the peritenon and subcutaneous tissue and a 4-0 Monocryl undyed for the skin.  Attention was then directed to the Achilles tendon rupture site just proximal to the Achilles insertional region.  A longitudinal incision was performed.  Sharp and blunt dissection carried down to  the peritenon.  The peritenon was then incised.  The Achilles tendon was noted.  There was a large bulbous area of the Achilles tendon at its chronically ruptured site.  Longitudinal incision was made within the tendon.  Debulking of the tendon was performed.  There was noted to be intratendinous hemorrhagic tissue.  Also within the tendon itself there were no areas of abnormality consistent with likely intratendinous gout crystals.  Large portion of the Achilles tendon was sent for pathological examination.  A second section was sent fresh with saline for evaluation of crystals.  The wound was then flushed with copious amounts of irrigation.  Less than 50% of the Achilles tendon had been removed at this point.  Felt comfortable for closure of the Achilles without additional grafting of the FHL tendon at this time.  The intratendinous region was repaired with a 3-0 Vicryl.  The intratendinous region was also infiltrated with the Topaz wand.  The superficial portion of the tendon was then closed with a 3-0 Vicryl.  The peritenon was closed over top with a 3-0 Vicryl as well as the subcutaneous tissue.  The skin was closed with a 4-0 Monocryl.  Patient was placed in a well compressive bulky sterile dressing.  Placement of a posterior splint was then applied by myself.  Foot was held in a neutral 90 degree position.    Patient tolerated the procedure and anesthesia well.  Was transported from the OR to the PACU with all vital signs stable and vascular status intact. To be discharged per routine protocol.  Will follow  up in approximately 1 week in the outpatient clinic.

## 2019-09-25 NOTE — Transfer of Care (Signed)
Immediate Anesthesia Transfer of Care Note  Patient: Cody Caldwell  Procedure(s) Performed: GASTROC RECESSION EXTREMITY (Left ) ACHILLES TENDON REPAIR SECONDARY (Left )  Patient Location: PACU  Anesthesia Type:General  Level of Consciousness: awake and patient cooperative  Airway & Oxygen Therapy: Patient Spontanous Breathing and Patient connected to face mask oxygen  Post-op Assessment: Report given to RN and Post -op Vital signs reviewed and stable  Post vital signs: Reviewed and stable  Last Vitals:  Vitals Value Taken Time  BP 138/92 09/25/19 1123  Temp 36.2 C 09/25/19 1123  Pulse 74 09/25/19 1124  Resp 13 09/25/19 1124  SpO2 100 % 09/25/19 1124  Vitals shown include unvalidated device data.  Last Pain:  Vitals:   09/25/19 1123  TempSrc: Temporal  PainSc: 0-No pain         Complications: No apparent anesthesia complications

## 2019-09-25 NOTE — Discharge Instructions (Signed)
These instructions will give you an idea of what to expect after surgery and how to manage issues that may arise before your first post op office visit.  Pain Management Pain is best managed by "staying ahead" of it. If pain gets out of control, it is difficult to get it back under control. Local anesthesia that lasts 6-8 hours is used to numb the foot and decrease pain.  For the best pain control, take the pain medication every 4 hours for the first 2 days post op. On the third day pain medication can be taken as needed.   Post Op Nausea Nausea is common after surgery, so it is managed proactively.  If prescribed, use the prescribed nausea medication regularly for the first 2 days post op.  Bandages Do not worry if there is blood on the bandage. What looks like a lot of blood on the bandage is actually a small amount. Blood on the dressing spreads out as it is absorbed by the gauze, the same way a drop of water spreads out on a paper towel.  If the bandages feel wet or dry, stiff and uncomfortable, call the office during office hours and we will schedule a time for you to have the bandage changed.  Unless you are specifically told otherwise, we will do the first bandage change in the office.  Keep your bandage dry. If the bandage becomes wet or soiled, notify the office and we will schedule a time to change the bandage.  Activity It is best to spend most of the first 2 days after surgery lying down with the foot elevated above the level of your heart. Do not bear weight on your foot.  Use crutches or scooter as needed.  Driving Do not drive until you are able to respond in an emergency (i.e. slam on the brakes). This usually occurs after the bone or tendon has healed - 6 to 8 weeks.  Call the Office If you have a fever over 101F.  If you have increasing pain after the initial post op pain has settled down.  If you have increasing redness, swelling, or drainage.  If you have any questions  or concerns.  [place] DR. TROXLER, DR. Vickki Muff, AND DR. [place]   1. Take your medication as prescribed.  Pain medication should be taken only as needed.  2. Keep the dressing clean, dry and intact.  3. Keep your foot elevated above the heart level for the first 48 hours.  4. Walking to the bathroom and brief periods of walking are acceptable, unless we have instructed you to be non-weight bearing.  5. Always wear your post-op shoe when walking.  Always use your crutches if you are to be non-weight bearing.  6. Do not take a shower. Baths are permissible as long as the foot is kept out of the water.   7. Every hour you are awake:  - Bend your knee 15 times.  8. Call [facility] [phone]) if any of the following problems occur: - You develop a temperature or fever. - The bandage becomes saturated with blood. - Medication does not stop your pain. - Injury of the foot occurs. - Any symptoms of infection including redness, odor, or red streaks running from wound.

## 2019-09-25 NOTE — Anesthesia Procedure Notes (Signed)
Procedure Name: Intubation Date/Time: 09/25/2019 9:31 AM Performed by: Hedda Slade, CRNA Pre-anesthesia Checklist: Patient identified, Patient being monitored, Timeout performed, Emergency Drugs available and Suction available Patient Re-evaluated:Patient Re-evaluated prior to induction Oxygen Delivery Method: Circle system utilized Preoxygenation: Pre-oxygenation with 100% oxygen Induction Type: IV induction Ventilation: Mask ventilation without difficulty Laryngoscope Size: McGraph and 4 Grade View: Grade I Tube type: Oral Tube size: 7.5 mm Number of attempts: 1 Airway Equipment and Method: Stylet Placement Confirmation: ETT inserted through vocal cords under direct vision,  positive ETCO2 and breath sounds checked- equal and bilateral Secured at: 22 cm Tube secured with: Tape Dental Injury: Teeth and Oropharynx as per pre-operative assessment

## 2019-09-28 LAB — SURGICAL PATHOLOGY

## 2019-10-07 NOTE — Anesthesia Postprocedure Evaluation (Signed)
Anesthesia Post Note  Patient: Cody Caldwell  Procedure(s) Performed: GASTROC RECESSION EXTREMITY (Left ) ACHILLES TENDON REPAIR SECONDARY (Left )  Patient location during evaluation: PACU Anesthesia Type: General Level of consciousness: awake and alert Pain management: pain level controlled Vital Signs Assessment: post-procedure vital signs reviewed and stable Respiratory status: spontaneous breathing and respiratory function stable Cardiovascular status: stable Anesthetic complications: no     Last Vitals:  Vitals:   09/25/19 1212 09/25/19 1254  BP: (!) 151/96 (!) 143/91  Pulse: 70 67  Resp: 16 16  Temp: (!) 36.4 C   SpO2: 98% 98%    Last Pain:  Vitals:   09/28/19 0820  TempSrc:   PainSc: 0-No pain                 Greely Atiyeh K

## 2021-07-17 ENCOUNTER — Emergency Department: Payer: Managed Care, Other (non HMO)

## 2021-07-17 ENCOUNTER — Emergency Department
Admission: EM | Admit: 2021-07-17 | Discharge: 2021-07-17 | Disposition: A | Discharge status: Home / Self Care | Payer: Managed Care, Other (non HMO) | Attending: Emergency Medicine | Admitting: Emergency Medicine

## 2021-07-17 ENCOUNTER — Other Ambulatory Visit: Payer: Self-pay

## 2021-07-17 DIAGNOSIS — Z7982 Long term (current) use of aspirin: Secondary | ICD-10-CM | POA: Insufficient documentation

## 2021-07-17 DIAGNOSIS — X58XXXA Exposure to other specified factors, initial encounter: Secondary | ICD-10-CM | POA: Diagnosis not present

## 2021-07-17 DIAGNOSIS — S20212A Contusion of left front wall of thorax, initial encounter: Secondary | ICD-10-CM | POA: Diagnosis not present

## 2021-07-17 DIAGNOSIS — S299XXA Unspecified injury of thorax, initial encounter: Secondary | ICD-10-CM | POA: Diagnosis present

## 2021-07-17 DIAGNOSIS — S29011A Strain of muscle and tendon of front wall of thorax, initial encounter: Secondary | ICD-10-CM

## 2021-07-17 DIAGNOSIS — K219 Gastro-esophageal reflux disease without esophagitis: Secondary | ICD-10-CM | POA: Insufficient documentation

## 2021-07-17 DIAGNOSIS — I10 Essential (primary) hypertension: Secondary | ICD-10-CM | POA: Diagnosis not present

## 2021-07-17 DIAGNOSIS — Z79899 Other long term (current) drug therapy: Secondary | ICD-10-CM | POA: Insufficient documentation

## 2021-07-17 LAB — COMPREHENSIVE METABOLIC PANEL
ALT: 27 U/L (ref 0–44)
AST: 19 U/L (ref 15–41)
Albumin: 3.5 g/dL (ref 3.5–5.0)
Alkaline Phosphatase: 81 U/L (ref 38–126)
Anion gap: 7 (ref 5–15)
BUN: 24 mg/dL — ABNORMAL HIGH (ref 8–23)
CO2: 25 mmol/L (ref 22–32)
Calcium: 9.1 mg/dL (ref 8.9–10.3)
Chloride: 106 mmol/L (ref 98–111)
Creatinine, Ser: 1.01 mg/dL (ref 0.61–1.24)
GFR, Estimated: >60 mL/min (ref >60)
Glucose, Bld: 119 mg/dL — ABNORMAL HIGH (ref 70–99)
Potassium: 4.2 mmol/L (ref 3.5–5.1)
Sodium: 138 mmol/L (ref 135–145)
Total Bilirubin: 0.5 mg/dL (ref 0.3–1.2)
Total Protein: 7.6 g/dL (ref 6.5–8.1)

## 2021-07-17 LAB — CBC
HCT: 45 % (ref 39.0–52.0)
Hemoglobin: 15 g/dL (ref 13.0–17.0)
MCH: 30.5 pg (ref 26.0–34.0)
MCHC: 33.3 g/dL (ref 30.0–36.0)
MCV: 91.5 fL (ref 80.0–100.0)
Platelets: 352 10*3/uL (ref 150–400)
RBC: 4.92 MIL/uL (ref 4.22–5.81)
RDW: 12.4 % (ref 11.5–15.5)
WBC: 7.3 10*3/uL (ref 4.0–10.5)
nRBC: 0 % (ref 0.0–0.2)

## 2021-07-17 LAB — TROPONIN I (HIGH SENSITIVITY)
Troponin I (High Sensitivity): 5 ng/L (ref <18)
Troponin I (High Sensitivity): 4 ng/L (ref <18)

## 2021-07-17 NOTE — ED Notes (Signed)
Patient verbalizes understanding of discharge instructions. Opportunity for questioning and answers were provided. Armband removed by staff, pt discharged from ED. Ambulated out to lobby with wife  

## 2021-07-17 NOTE — Discharge Instructions (Signed)
Your EKG, Chest xray, lab tests, and exam today are all reassuring.  You can use anti-inflammatory medicine and a heating pad as needed for pain.  After today, gentle stretching of the left chest wall by leaning to the right can be helpful as well.

## 2021-07-17 NOTE — ED Provider Notes (Signed)
[facility] Emergency Department Provider Note  ____________________________________________  Time seen: Approximately 10:11 AM  I have reviewed the triage vital signs and the nursing notes.   HISTORY  Chief Complaint Chest Pain    HPI Cody Caldwell is a 60 y.o. male with a past history of paroxysmal atrial fibrillation, hypertension who comes ED complaining of left-sided chest pain in the axilla which started this morning at about 6:00 AM as the patient was getting out of bed.  It felt sharp and intense, nonradiating, no shortness of breath diaphoresis or vomiting.  Not exertional, not pleuritic.  Constant, but now gradually resolving.  Pain is mild at this point.  No aggravating or alleviating factors at this point.    Past Medical History:  Diagnosis Date   Atrial fibrillation ([place])    Diverticulosis    Dysrhythmia    a fib   GERD (gastroesophageal reflux disease)    Hemorrhoids, internal, with bleeding    Hyperlipidemia    Hypertension    Pneumonia 06/2015   Rectal fissure    Valvular heart disease    VHD (valvular heart disease)      There are no problems to display for this patient.    Past Surgical History:  Procedure Laterality Date   ACHILLES TENDON SURGERY Left 09/25/2019   Procedure: ACHILLES TENDON REPAIR SECONDARY;  Surgeon: Samara Deist, DPM;  Location: [facility] ORS;  Service: Podiatry;  Laterality: Left;   COLONOSCOPY     COLONOSCOPY WITH PROPOFOL N/A 11/11/2015   Procedure: COLONOSCOPY WITH PROPOFOL;  Surgeon: Manya Silvas, MD;  Location: [facility] ENDOSCOPY;  Service: Endoscopy;  Laterality: N/A;   COLONOSCOPY WITH PROPOFOL N/A 03/30/2019   Procedure: COLONOSCOPY WITH PROPOFOL;  Surgeon: Toledo, Benay Pike, MD;  Location: [facility] ENDOSCOPY;  Service: Gastroenterology;  Laterality: N/A;   ESOPHAGOGASTRODUODENOSCOPY     ESOPHAGOGASTRODUODENOSCOPY (EGD) WITH PROPOFOL N/A 11/11/2015   Procedure: ESOPHAGOGASTRODUODENOSCOPY (EGD) WITH  PROPOFOL;  Surgeon: Manya Silvas, MD;  Location: [facility] ENDOSCOPY;  Service: Endoscopy;  Laterality: N/A;   EYE SURGERY Bilateral    lasik   GASTROC RECESSION EXTREMITY Left 09/25/2019   Procedure: GASTROC RECESSION EXTREMITY;  Surgeon: Samara Deist, DPM;  Location: [facility] ORS;  Service: Podiatry;  Laterality: Left;   KNEE SURGERY Right      Prior to Admission medications   Medication Sig Start Date End Date Taking? Authorizing Provider  aspirin 325 MG tablet Take 325 mg by mouth daily.     [provider]  diclofenac Sodium (VOLTAREN) 1 % GEL Apply 1 application topically at bedtime. 09/09/19   [provider]  diltiazem (CARDIZEM) 60 MG tablet Take 60 mg by mouth 4 (four) times daily as needed (afib).  09/11/19   [provider]  lisinopril (PRINIVIL,ZESTRIL) 10 MG tablet Take 10 mg by mouth 2 (two) times daily.     [provider]  Multiple Vitamins-Minerals (MULTIVITAMIN WITH MINERALS) tablet Take 1 tablet by mouth daily.    [provider]  naproxen sodium (ALEVE) 220 MG tablet Take 660 mg by mouth 3 (three) times daily as needed (pain).    [provider]  Omega-3 Fatty Acids (OMEGA 3 PO) Take 1 capsule by mouth 2 (two) times daily.    [provider]  omeprazole (PRILOSEC) 20 MG capsule Take 20 mg by mouth daily.    [provider]  OVER THE COUNTER MEDICATION Take 1 tablet by mouth 2 (two) times daily. Inner balance otc supplement    [provider]  Polyethyl Glycol-Propyl Glycol (SYSTANE OP) Place 1 drop into both eyes 2 (two) times daily.    [provider]  propafenone (RYTHMOL SR) 225 MG 12 hr capsule Take 225 mg by mouth 2 (two) times daily.    [provider]  Specialty Vitamins Products (ULTIMATE FAT BURNER) TABS Take 1 tablet by mouth daily.    [provider]  tadalafil (CIALIS) 20 MG tablet Take 20 mg by mouth every three (3) days as needed for erectile dysfunction.     [provider]     Allergies Patient has no known allergies.   No family history on file.  Social History Social History   Tobacco Use   Smoking status: Never   Smokeless tobacco: Never  Vaping Use   Vaping Use: Never used  Substance Use Topics   Alcohol use: Yes    Alcohol/week: 3.0 standard drinks    Types: 3 Glasses of wine per week   Drug use: Never    Review of Systems  Constitutional:   No fever or chills.  ENT:   No sore throat. No rhinorrhea. Cardiovascular: Positive chest pain as above without syncope. Respiratory:   No dyspnea or cough. Gastrointestinal:   Negative for abdominal pain, vomiting and diarrhea.  Musculoskeletal:   Negative for focal pain or swelling All other systems reviewed and are negative except as documented above in ROS and HPI.  ____________________________________________   PHYSICAL EXAM:  VITAL SIGNS: ED Triage Vitals [07/17/21 0704]  Enc Vitals Group     BP (!) 152/97     Pulse Rate 67     Resp 20     Temp 97.6 F (36.4 C)     Temp Source Oral     SpO2 98 %     Weight 230 lb (104.3 kg)     Height 6\' 1"  (1.854 m)     Head Circumference      Peak Flow      Pain Score 2     Pain Loc      Pain Edu?      Excl. in [place]?     Vital signs reviewed, nursing assessments reviewed.   Constitutional:   Alert and oriented. Non-toxic appearance. Eyes:   Conjunctivae are normal. EOMI. PERRL. ENT      Head:   Normocephalic and atraumatic.      Nose:   Wearing a mask.      Mouth/Throat:   Wearing a mask.      Neck:   No meningismus. Full ROM. Hematological/Lymphatic/Immunilogical:   No cervical lymphadenopathy. Cardiovascular:   RRR. Symmetric bilateral radial and DP pulses.  No murmurs. Cap refill less than 2 seconds. Respiratory:   Normal respiratory effort without tachypnea/retractions. Breath sounds are clear and equal bilaterally. No wheezes/rales/rhonchi. Gastrointestinal:   Soft and nontender. Non distended. There  is no CVA tenderness.  No rebound, rigidity, or guarding. Genitourinary:   deferred Musculoskeletal:   Normal range of motion in all extremities.  There is chest wall tenderness of the left lateral chest and axilla around the third intercostal space which reproduces his pain..  No lower extremity tenderness.  No edema. Neurologic:   Normal speech and language.  Motor grossly intact. No acute focal neurologic deficits are appreciated.  Skin:    Skin is warm, dry and intact. No rash noted.  No petechiae, purpura, or bullae.  ____________________________________________    LABS (pertinent positives/negatives) (all labs ordered are listed, but only abnormal results  are displayed) Labs Reviewed  COMPREHENSIVE METABOLIC PANEL - Abnormal; Notable for the following components:      Result Value   Glucose, Bld 119 (*)    BUN 24 (*)    All other components within normal limits  CBC  TROPONIN I (HIGH SENSITIVITY)  TROPONIN I (HIGH SENSITIVITY)   ____________________________________________   EKG  Interpreted by me  Date: 07/17/2021  Rate: 64  Rhythm: normal sinus rhythm  QRS Axis: normal  Intervals: normal  ST/T Wave abnormalities: normal  Conduction Disutrbances: none  Narrative Interpretation: unremarkable     ____________________________________________    RADIOLOGY  DG Chest 2 View  Result Date: 07/17/2021 CLINICAL DATA:  Chest pain. EXAM: CHEST - 2 VIEW COMPARISON:  Chest XR, 08/14/2017. FINDINGS: The heart size and mediastinal contours are within normal limits. Both lungs are clear. The visualized skeletal structures are unremarkable. IMPRESSION: Normal chest. Electronically Signed   By: Michaelle Birks M.D.   On: 07/17/2021 07:31    ____________________________________________   PROCEDURES Procedures  ____________________________________________  DIFFERENTIAL DIAGNOSIS   Intercostal muscle strain, pneumothorax, non-STEMI, GERD  CLINICAL IMPRESSION / ASSESSMENT  AND PLAN / ED COURSE  Medications ordered in the ED: Medications - No data to display  Pertinent labs & imaging results that were available during my care of the patient were reviewed by me and considered in my medical decision making (see chart for details).  Cody Caldwell was evaluated in Emergency Department on 07/17/2021 for the symptoms described in the history of present illness. He was evaluated in the context of the global COVID-19 pandemic, which necessitated consideration that the patient might be at risk for infection with the SARS-CoV-2 virus that causes COVID-19. Institutional protocols and algorithms that pertain to the evaluation of patients at risk for COVID-19 are in a state of rapid change based on information released by regulatory bodies including the CDC and federal and state organizations. These policies and algorithms were followed during the patient's care in the ED.   Patient presents with atypical chest pain on the left chest wall, reproducible on exam. Considering the patient's symptoms, medical history, and physical examination today, I have low suspicion for ACS, PE, TAD, pneumothorax, carditis, mediastinitis, pneumonia, CHF, or sepsis. Due to his age, comorbidities, serial troponins obtained which were normal.  EKG is unremarkable, chest x-ray and exam are reassuring.  Exam is consistent with a intercostal muscle strain, stable for discharge home.      ____________________________________________   FINAL CLINICAL IMPRESSION(S) / ED DIAGNOSES    Final diagnoses:  Intercostal muscle strain, initial encounter     ED Discharge Orders     None       Portions of this note were generated with dragon dictation software. Dictation errors may occur despite best attempts at proofreading.    Carrie Mew, MD 07/17/21 1015

## 2021-07-17 NOTE — ED Triage Notes (Signed)
Pt in with co chest pain that started this am, has a hx of afib but states feels different. Has had a cough recently and had a covid test 2 weeks ago that was uncertain.

## 2022-01-12 ENCOUNTER — Ambulatory Visit: Admit: 2022-01-12 | Payer: Managed Care, Other (non HMO) | Admitting: Gastroenterology

## 2022-01-12 SURGERY — COLONOSCOPY
Anesthesia: General

## 2022-01-12 SURGERY — COLONOSCOPY WITH PROPOFOL
Anesthesia: General

## 2022-03-30 ENCOUNTER — Encounter
Admission: RE | Disposition: A | Discharge status: Home / Self Care | Payer: Self-pay | Source: Ambulatory Visit | Attending: Gastroenterology

## 2022-03-30 ENCOUNTER — Ambulatory Visit
Admission: RE | Admit: 2022-03-30 | Discharge: 2022-03-30 | Disposition: A | Discharge status: Home / Self Care | Payer: Managed Care, Other (non HMO) | Source: Ambulatory Visit | Attending: Gastroenterology | Admitting: Gastroenterology

## 2022-03-30 ENCOUNTER — Encounter: Payer: Self-pay | Admitting: Gastroenterology

## 2022-03-30 ENCOUNTER — Ambulatory Visit: Payer: Managed Care, Other (non HMO) | Admitting: General Practice

## 2022-03-30 DIAGNOSIS — K5730 Diverticulosis of large intestine without perforation or abscess without bleeding: Secondary | ICD-10-CM | POA: Diagnosis not present

## 2022-03-30 DIAGNOSIS — K449 Diaphragmatic hernia without obstruction or gangrene: Secondary | ICD-10-CM | POA: Diagnosis not present

## 2022-03-30 DIAGNOSIS — K2970 Gastritis, unspecified, without bleeding: Secondary | ICD-10-CM | POA: Diagnosis not present

## 2022-03-30 DIAGNOSIS — K317 Polyp of stomach and duodenum: Secondary | ICD-10-CM | POA: Insufficient documentation

## 2022-03-30 DIAGNOSIS — K2270 Barrett's esophagus without dysplasia: Secondary | ICD-10-CM | POA: Insufficient documentation

## 2022-03-30 DIAGNOSIS — K2289 Other specified disease of esophagus: Secondary | ICD-10-CM | POA: Diagnosis not present

## 2022-03-30 DIAGNOSIS — K921 Melena: Secondary | ICD-10-CM | POA: Diagnosis not present

## 2022-03-30 DIAGNOSIS — K219 Gastro-esophageal reflux disease without esophagitis: Secondary | ICD-10-CM | POA: Diagnosis not present

## 2022-03-30 DIAGNOSIS — I4891 Unspecified atrial fibrillation: Secondary | ICD-10-CM | POA: Diagnosis not present

## 2022-03-30 DIAGNOSIS — I10 Essential (primary) hypertension: Secondary | ICD-10-CM | POA: Insufficient documentation

## 2022-03-30 DIAGNOSIS — K640 First degree hemorrhoids: Secondary | ICD-10-CM | POA: Diagnosis not present

## 2022-03-30 DIAGNOSIS — Z8719 Personal history of other diseases of the digestive system: Secondary | ICD-10-CM | POA: Insufficient documentation

## 2022-03-30 DIAGNOSIS — K6389 Other specified diseases of intestine: Secondary | ICD-10-CM | POA: Diagnosis not present

## 2022-03-30 HISTORY — PX: COLONOSCOPY: SHX5424

## 2022-03-30 HISTORY — PX: ESOPHAGOGASTRODUODENOSCOPY: SHX5428

## 2022-03-30 SURGERY — COLONOSCOPY
Anesthesia: General

## 2022-03-30 MED ORDER — PROPOFOL 10 MG/ML IV BOLUS
INTRAVENOUS | Status: AC
  Filled 2022-03-30: qty 20

## 2022-03-30 MED ORDER — PROPOFOL 500 MG/50ML IV EMUL
INTRAVENOUS | Status: DC | PRN
  Administered 2022-03-30: 165 ug/kg/min via INTRAVENOUS

## 2022-03-30 MED ORDER — SODIUM CHLORIDE 0.9 % IV SOLN
INTRAVENOUS | Status: DC
  Administered 2022-03-30: 20 mL/h via INTRAVENOUS

## 2022-03-30 NOTE — Transfer of Care (Signed)
Immediate Anesthesia Transfer of Care Note  Patient: Cody Caldwell  Procedure(s) Performed: COLONOSCOPY ESOPHAGOGASTRODUODENOSCOPY (EGD)  Patient Location: PACU  Anesthesia Type:General  Level of Consciousness: awake and alert   Airway & Oxygen Therapy: Patient Spontanous Breathing and Patient connected to nasal cannula oxygen  Post-op Assessment: Report given to RN and Post -op Vital signs reviewed and stable  Post vital signs: Reviewed and stable  Last Vitals:  Vitals Value Taken Time  BP 92/62 03/30/22 1020  Temp 35.7 C 03/30/22 1019  Pulse 61 03/30/22 1021  Resp 14 03/30/22 1021  SpO2 99 % 03/30/22 1021  Vitals shown include unvalidated device data.  Last Pain:  Vitals:   03/30/22 1019  TempSrc: Temporal  PainSc: Asleep         Complications: No notable events documented.

## 2022-03-30 NOTE — Interval H&P Note (Signed)
History and Physical Interval Note: Preprocedure H&P from 03/30/22  was reviewed and there was no interval change after seeing and examining the patient.  Written consent was obtained from the patient after discussion of risks, benefits, and alternatives. Patient has consented to proceed with Esophagogastroduodenoscopy and Colonoscopy with possible intervention   03/30/2022 9:19 AM  Cody Caldwell  has presented today for surgery, with the diagnosis of Barrett's esophagus without dysplasia (K22.70) Rectal bleeding (K62.5) Gastroesophageal reflux disease, unspecified whether esophagitis present (K21.9).  The various methods of treatment have been discussed with the patient and family. After consideration of risks, benefits and other options for treatment, the patient has consented to  Procedure(s): COLONOSCOPY (N/A) ESOPHAGOGASTRODUODENOSCOPY (EGD) (N/A) as a surgical intervention.  The patient's history has been reviewed, patient examined, no change in status, stable for surgery.  I have reviewed the patient's chart and labs.  Questions were answered to the patient's satisfaction.     Annamaria Helling

## 2022-03-30 NOTE — H&P (Signed)
Pre-Procedure H&P   Patient ID: Cody Caldwell is a 61 y.o. male.  Gastroenterology Provider: Annamaria Helling, DO  Referring Provider: Laurine Blazer, PA PCP: Idelle Crouch, MD  Date: 03/30/2022  HPI Mr. Cody Caldwell is a 61 y.o. male who presents today for Esophagogastroduodenoscopy and Colonoscopy for suveillance- Barretts esophagus; hematochezia.  Pt with rare reflux. Had egd in the past that demonstrated barretts esophagus; ~2cm in length. However, bx negative for BE. He denies n/v dysphagia, odynophagia  BM qd w/o melena. Has noted some blood with wiping. Phx rectal fissure.  CSY 03/2019 with IH, pandiverticulosis, no polyp; csy in 10/2015 and 07/2010 negative  Most recent lab work cr 1.0, hgb 14.7, mcv 91.5, plt 342  Past Medical History:  Diagnosis Date   Atrial fibrillation ([place])    Diverticulosis    Dysrhythmia    a fib   GERD (gastroesophageal reflux disease)    Hemorrhoids, internal, with bleeding    Hyperlipidemia    Hypertension    Pneumonia 06/2015   Rectal fissure    Valvular heart disease    VHD (valvular heart disease)     Past Surgical History:  Procedure Laterality Date   ACHILLES TENDON SURGERY Left 09/25/2019   Procedure: ACHILLES TENDON REPAIR SECONDARY;  Surgeon: Samara Deist, DPM;  Location: [facility] ORS;  Service: Podiatry;  Laterality: Left;   COLONOSCOPY     COLONOSCOPY WITH PROPOFOL N/A 11/11/2015   Procedure: COLONOSCOPY WITH PROPOFOL;  Surgeon: Manya Silvas, MD;  Location: [facility] ENDOSCOPY;  Service: Endoscopy;  Laterality: N/A;   COLONOSCOPY WITH PROPOFOL N/A 03/30/2019   Procedure: COLONOSCOPY WITH PROPOFOL;  Surgeon: Toledo, Benay Pike, MD;  Location: [facility] ENDOSCOPY;  Service: Gastroenterology;  Laterality: N/A;   ESOPHAGOGASTRODUODENOSCOPY     ESOPHAGOGASTRODUODENOSCOPY (EGD) WITH PROPOFOL N/A 11/11/2015   Procedure: ESOPHAGOGASTRODUODENOSCOPY (EGD) WITH PROPOFOL;  Surgeon: Manya Silvas, MD;  Location: [facility] ENDOSCOPY;   Service: Endoscopy;  Laterality: N/A;   EYE SURGERY Bilateral    lasik   GASTROC RECESSION EXTREMITY Left 09/25/2019   Procedure: GASTROC RECESSION EXTREMITY;  Surgeon: Samara Deist, DPM;  Location: [facility] ORS;  Service: Podiatry;  Laterality: Left;   KNEE SURGERY Right     Family History Brother and Grandfather with CRC No h/o GI disease or malignancy  Review of Systems  Constitutional:  Negative for activity change, appetite change, chills, diaphoresis, fatigue, fever and unexpected weight change.  HENT:  Negative for trouble swallowing and voice change.   Respiratory:  Negative for shortness of breath and wheezing.   Cardiovascular:  Negative for chest pain, palpitations and leg swelling.  Gastrointestinal:  Positive for blood in stool. Negative for abdominal distention, abdominal pain, anal bleeding, constipation, diarrhea, nausea and vomiting.       Rare reflux  Musculoskeletal:  Negative for arthralgias and myalgias.  Skin:  Negative for color change and pallor.  Neurological:  Negative for dizziness, syncope and weakness.  Psychiatric/Behavioral:  Negative for confusion. The patient is not nervous/anxious.   All other systems reviewed and are negative.    Medications No current facility-administered medications on file prior to encounter.   Current Outpatient Medications on File Prior to Encounter  Medication Sig Dispense Refill   aspirin 325 MG tablet Take 325 mg by mouth daily.      diclofenac Sodium (VOLTAREN) 1 % GEL Apply 1 application topically at bedtime.     diltiazem (CARDIZEM) 60 MG tablet Take 60 mg by mouth 4 (four) times daily as needed (  afib).      lisinopril (PRINIVIL,ZESTRIL) 10 MG tablet Take 10 mg by mouth 2 (two) times daily.      Multiple Vitamins-Minerals (MULTIVITAMIN WITH MINERALS) tablet Take 1 tablet by mouth daily.     naproxen sodium (ALEVE) 220 MG tablet Take 660 mg by mouth 3 (three) times daily as needed (pain).     Omega-3 Fatty Acids (OMEGA 3  PO) Take 1 capsule by mouth 2 (two) times daily.     omeprazole (PRILOSEC) 20 MG capsule Take 20 mg by mouth daily.     OVER THE COUNTER MEDICATION Take 1 tablet by mouth 2 (two) times daily. Inner balance otc supplement     Polyethyl Glycol-Propyl Glycol (SYSTANE OP) Place 1 drop into both eyes 2 (two) times daily.     propafenone (RYTHMOL SR) 225 MG 12 hr capsule Take 225 mg by mouth 2 (two) times daily.     Specialty Vitamins Products (ULTIMATE FAT BURNER) TABS Take 1 tablet by mouth daily.     tadalafil (CIALIS) 20 MG tablet Take 20 mg by mouth every three (3) days as needed for erectile dysfunction.      Pertinent medications related to GI and procedure were reviewed by me with the patient prior to the procedure   Current Facility-Administered Medications:    0.9 %  sodium chloride infusion, , Intravenous, Continuous, Annamaria Helling, DO, Last Rate: 20 mL/hr at 03/30/22 0855, 20 mL/hr at 03/30/22 0855      No Known Allergies Allergies were reviewed by me prior to the procedure  Objective   Body mass index is 31 kg/m. Vitals:   03/30/22 0845  BP: (!) 125/96  Pulse: 63  Resp: 20  Temp: (!) 96.2 F (35.7 C)  TempSrc: Temporal  SpO2: 98%  Weight: 106.6 kg  Height: '6\' 1"'$  (1.854 m)     Physical Exam Vitals and nursing note reviewed.  Constitutional:      General: He is not in acute distress.    Appearance: Normal appearance. He is not ill-appearing, toxic-appearing or diaphoretic.  HENT:     Head: Normocephalic and atraumatic.     Nose: Nose normal.     Mouth/Throat:     Mouth: Mucous membranes are moist.     Pharynx: Oropharynx is clear.  Eyes:     General: No scleral icterus.    Extraocular Movements: Extraocular movements intact.  Cardiovascular:     Rate and Rhythm: Normal rate and regular rhythm.     Heart sounds: No murmur heard.    No friction rub. No gallop.  Pulmonary:     Effort: Pulmonary effort is normal. No respiratory distress.     Breath  sounds: Normal breath sounds. No wheezing, rhonchi or rales.  Abdominal:     General: Bowel sounds are normal. There is no distension.     Palpations: Abdomen is soft.     Tenderness: There is no abdominal tenderness. There is no guarding or rebound.  Musculoskeletal:     Cervical back: Neck supple.     Right lower leg: No edema.     Left lower leg: No edema.  Skin:    General: Skin is warm and dry.     Coloration: Skin is not jaundiced or pale.  Neurological:     General: No focal deficit present.     Mental Status: He is alert and oriented to person, place, and time. Mental status is at baseline.  Psychiatric:  Mood and Affect: Mood normal.        Behavior: Behavior normal.        Thought Content: Thought content normal.        Judgment: Judgment normal.      Assessment:  Mr. JESTIN BURBACH is a 61 y.o. male  who presents today for Esophagogastroduodenoscopy and Colonoscopy for suveillance- Barretts esophagus; hematochezia.  Plan:  Esophagogastroduodenoscopy and Colonoscopy with possible intervention today  Esophagogastroduodenoscopy and Colonoscopy with possible biopsy, control of bleeding, polypectomy, and interventions as necessary has been discussed with the patient/patient representative. Informed consent was obtained from the patient/patient representative after explaining the indication, nature, and risks of the procedure including but not limited to death, bleeding, perforation, missed neoplasm/lesions, cardiorespiratory compromise, and reaction to medications. Opportunity for questions was given and appropriate answers were provided. Patient/patient representative has verbalized understanding is amenable to undergoing the procedure.   Annamaria Helling, DO  [facility] Gastroenterology  Portions of the record may have been created with voice recognition software. Occasional wrong-word or 'sound-a-like' substitutions may have occurred due to the inherent  limitations of voice recognition software.  Read the chart carefully and recognize, using context, where substitutions may have occurred.

## 2022-03-30 NOTE — Op Note (Signed)
[facility] Gastroenterology Patient Name: Cody Caldwell Procedure Date: 03/30/2022 9:12 AM MRN: [id number] Account #: [id number] Date of Birth: October 28, 1960 Admit Type: Outpatient Age: 61 Room: [facility] ENDO ROOM 1 Gender: Male Note Status: Finalized Instrument Name: Upper Endoscope [id number] Procedure:             Upper GI endoscopy Indications:           Follow-up of Barrett's esophagus Providers:             Rueben Bash, DO Referring MD:          Leonie Douglas. Doy Hutching, MD (Referring MD) Medicines:             Monitored Anesthesia Care Complications:         No immediate complications. Estimated blood loss:                         Minimal. Procedure:             Pre-Anesthesia Assessment:                        - Prior to the procedure, a History and Physical was                         performed, and patient medications and allergies were                         reviewed. The patient is competent. The risks and                         benefits of the procedure and the sedation options and                         risks were discussed with the patient. All questions                         were answered and informed consent was obtained.                         Patient identification and proposed procedure were                         verified by the physician, the nurse, the anesthetist                         and the technician in the endoscopy suite. Mental                         Status Examination: alert and oriented. Airway                         Examination: normal oropharyngeal airway and neck                         mobility. Respiratory Examination: clear to                         auscultation. CV Examination: RRR, no murmurs, no S3  or S4. Prophylactic Antibiotics: The patient does not                         require prophylactic antibiotics. Prior                         Anticoagulants: The patient has taken no previous                          anticoagulant or antiplatelet agents. ASA Grade                         Assessment: II - A patient with mild systemic disease.                         After reviewing the risks and benefits, the patient                         was deemed in satisfactory condition to undergo the                         procedure. The anesthesia plan was to use monitored                         anesthesia care (MAC). Immediately prior to                         administration of medications, the patient was                         re-assessed for adequacy to receive sedatives. The                         heart rate, respiratory rate, oxygen saturations,                         blood pressure, adequacy of pulmonary ventilation, and                         response to care were monitored throughout the                         procedure. The physical status of the patient was                         re-assessed after the procedure.                        After obtaining informed consent, the endoscope was                         passed under direct vision. Throughout the procedure,                         the patient's blood pressure, pulse, and oxygen                         saturations were monitored continuously. The Endoscope  was introduced through the mouth, and advanced to the                         second part of duodenum. The upper GI endoscopy was                         accomplished without difficulty. The patient tolerated                         the procedure well. Findings:      The duodenal bulb and first portion of the duodenum were normal.       Estimated blood loss: none.      Localized mild inflammation characterized by erythema was found in the       gastric antrum. Biopsies were taken with a cold forceps for Helicobacter       pylori testing. Estimated blood loss was minimal.      A 4 cm hiatal hernia was present. Estimated blood loss: none.       Esophagogastric landmarks were identified: the gastroesophageal junction       was found at 37 cm from the incisors.      The Z-line was irregular. Biopsies were taken with a cold forceps for       histology. taken in four quadrant fashion. Estimated blood loss was       minimal.      The exam of the esophagus was otherwise normal.      Multiple less than 5 mm sessile polyps with no bleeding and no stigmata       of recent bleeding were found in the gastric body. Estimated blood loss:       none. Impression:            - Normal duodenal bulb and first portion of the                         duodenum.                        - Gastritis. Biopsied.                        - 4 cm hiatal hernia.                        - Esophagogastric landmarks identified.                        - Z-line irregular. Biopsied.                        - Multiple gastric polyps. Recommendation:        - Patient has a contact number available for                         emergencies. The signs and symptoms of potential                         delayed complications were discussed with the patient.                         Return to normal activities tomorrow. Written  discharge instructions were provided to the patient.                        - Discharge patient to home.                        - Resume previous diet.                        - Continue present medications.                        - Await pathology results.                        - Repeat upper endoscopy for surveillance based on                         pathology results.                        - Return to referring physician as previously                         scheduled.                        - The findings and recommendations were discussed with                         the patient. Procedure Code(s):     --- Professional ---                        [phone], Esophagogastroduodenoscopy, flexible,                          transoral; with biopsy, single or multiple Diagnosis Code(s):     --- Professional ---                        K22.70, Barrett's esophagus without dysplasia                        K29.70, Gastritis, unspecified, without bleeding                        K44.9, Diaphragmatic hernia without obstruction or                         gangrene                        K22.8, Other specified diseases of esophagus CPT copyright 2019 American Medical Association. All rights reserved. The codes documented in this report are preliminary and upon coder review may  be revised to meet current compliance requirements. Attending Participation:      I personally performed the entire procedure. Volney American, DO Annamaria Helling DO, DO 03/30/2022 9:41:31 AM This report has been signed electronically. Number of Addenda: 0 Note Initiated On: 03/30/2022 9:12 AM Estimated Blood Loss:  Estimated blood loss was minimal.      [facility]

## 2022-03-30 NOTE — Anesthesia Preprocedure Evaluation (Signed)
Anesthesia Evaluation  Patient identified by MRN, date of birth, ID band Patient awake    Reviewed: Allergy & Precautions, NPO status , Patient's Chart, lab work & pertinent test results  History of Anesthesia Complications Negative for: history of anesthetic complications  Airway Mallampati: IV  TM Distance: >3 FB Neck ROM: full    Dental  (+) Teeth Intact   Pulmonary neg pulmonary ROS,    Pulmonary exam normal        Cardiovascular hypertension, (-) angina(-) Past MI, (-) Cardiac Stents and (-) CABG + dysrhythmias Atrial Fibrillation      Neuro/Psych negative neurological ROS  negative psych ROS   GI/Hepatic Neg liver ROS, GERD  ,  Endo/Other  negative endocrine ROS  Renal/GU negative Renal ROS  negative genitourinary   Musculoskeletal   Abdominal   Peds  Hematology negative hematology ROS (+)   Anesthesia Other Findings Past Medical History: No date: Atrial fibrillation (HCC) No date: Diverticulosis No date: Dysrhythmia     Comment:  a fib No date: GERD (gastroesophageal reflux disease) No date: Hemorrhoids, internal, with bleeding No date: Hyperlipidemia No date: Hypertension 06/2015: Pneumonia No date: Rectal fissure No date: Valvular heart disease No date: VHD (valvular heart disease)  Past Surgical History: 09/25/2019: ACHILLES TENDON SURGERY; Left     Comment:  Procedure: ACHILLES TENDON REPAIR SECONDARY;  Surgeon:               Samara Deist, DPM;  Location: [facility] ORS;  Service:               Podiatry;  Laterality: Left; No date: COLONOSCOPY 11/11/2015: COLONOSCOPY WITH PROPOFOL; N/A     Comment:  Procedure: COLONOSCOPY WITH PROPOFOL;  Surgeon: Manya Silvas, MD;  Location: [facility] ENDOSCOPY;  Service:               Endoscopy;  Laterality: N/A; 03/30/2019: COLONOSCOPY WITH PROPOFOL; N/A     Comment:  Procedure: COLONOSCOPY WITH PROPOFOL;  Surgeon: [place],               Benay Pike,  MD;  Location: [facility] ENDOSCOPY;  Service:               Gastroenterology;  Laterality: N/A; No date: ESOPHAGOGASTRODUODENOSCOPY 11/11/2015: ESOPHAGOGASTRODUODENOSCOPY (EGD) WITH PROPOFOL; N/A     Comment:  Procedure: ESOPHAGOGASTRODUODENOSCOPY (EGD) WITH               PROPOFOL;  Surgeon: Manya Silvas, MD;  Location: [facility]              ENDOSCOPY;  Service: Endoscopy;  Laterality: N/A; No date: EYE SURGERY; Bilateral     Comment:  lasik 09/25/2019: GASTROC RECESSION EXTREMITY; Left     Comment:  Procedure: GASTROC RECESSION EXTREMITY;  Surgeon:               Samara Deist, DPM;  Location: [facility] ORS;  Service:               Podiatry;  Laterality: Left; No date: KNEE SURGERY; Right  BMI    Body Mass Index: 31.00 kg/m      Reproductive/Obstetrics negative OB ROS                             Anesthesia Physical Anesthesia Plan  ASA: 2  Anesthesia Plan: General   Post-op Pain Management: Minimal or no pain  anticipated   Induction: Intravenous  PONV Risk Score and Plan: 3 and Propofol infusion, TIVA and Ondansetron  Airway Management Planned: Nasal Cannula  Additional Equipment: None  Intra-op Plan:   Post-operative Plan:   Informed Consent: I have reviewed the patients History and Physical, chart, labs and discussed the procedure including the risks, benefits and alternatives for the proposed anesthesia with the patient or authorized representative who has indicated his/her understanding and acceptance.     Dental advisory given  Plan Discussed with: CRNA and Surgeon  Anesthesia Plan Comments: (Discussed risks of anesthesia with patient, including possibility of difficulty with spontaneous ventilation under anesthesia necessitating airway intervention, PONV, and rare risks such as cardiac or respiratory or neurological events, and allergic reactions. Discussed the role of CRNA in patient's perioperative care. Patient understands.)         Anesthesia Quick Evaluation

## 2022-03-30 NOTE — Op Note (Signed)
[facility] Patient Name: Cody Caldwell Procedure Date: 03/30/2022 9:11 AM MRN: [id number] Account #: [id number] Date of Birth: Jul 15, 1961 Admit Type: Outpatient Age: 61 Room: [facility] ENDO ROOM 1 Gender: Male Note Status: Finalized Instrument Name: Colonoscope [id number] Procedure:             Colonoscopy Indications:           Hematochezia Providers:             Rueben Bash, DO Referring MD:          Leonie Douglas. Doy Hutching, MD (Referring MD) Medicines:             Monitored Anesthesia Care Complications:         No immediate complications. Estimated blood loss:                         Minimal. Procedure:             Pre-Anesthesia Assessment:                        - Prior to the procedure, a History and Physical was                         performed, and patient medications and allergies were                         reviewed. The patient is competent. The risks and                         benefits of the procedure and the sedation options and                         risks were discussed with the patient. All questions                         were answered and informed consent was obtained.                         Patient identification and proposed procedure were                         verified by the physician, the nurse, the anesthetist                         and the technician in the endoscopy suite. Mental                         Status Examination: alert and oriented. Airway                         Examination: normal oropharyngeal airway and neck                         mobility. Respiratory Examination: clear to                         auscultation. CV Examination: RRR, no murmurs, no S3  or S4. Prophylactic Antibiotics: The patient does not                         require prophylactic antibiotics. Prior                         Anticoagulants: The patient has taken no previous                          anticoagulant or antiplatelet agents. ASA Grade                         Assessment: II - A patient with mild systemic disease.                         After reviewing the risks and benefits, the patient                         was deemed in satisfactory condition to undergo the                         procedure. The anesthesia plan was to use monitored                         anesthesia care (MAC). Immediately prior to                         administration of medications, the patient was                         re-assessed for adequacy to receive sedatives. The                         heart rate, respiratory rate, oxygen saturations,                         blood pressure, adequacy of pulmonary ventilation, and                         response to care were monitored throughout the                         procedure. The physical status of the patient was                         re-assessed after the procedure.                        After obtaining informed consent, the colonoscope was                         passed under direct vision. Throughout the procedure,                         the patient's blood pressure, pulse, and oxygen                         saturations were monitored continuously. The  Colonoscope was introduced through the anus and                         advanced to the the terminal ileum, with                         identification of the appendiceal orifice and IC                         valve. The colonoscopy was performed without                         difficulty. The patient tolerated the procedure well.                         The quality of the bowel preparation was evaluated                         using the BBPS [facility] Bowel Preparation Scale) with                         scores of: Right Colon = 2 (minor amount of residual                         staining, small fragments of stool and/or opaque                         liquid, but mucosa  seen well), Transverse Colon = 3                         (entire mucosa seen well with no residual staining,                         small fragments of stool or opaque liquid) and Left                         Colon = 2 (minor amount of residual staining, small                         fragments of stool and/or opaque liquid, but mucosa                         seen well). The total BBPS score equals 7. The quality                         of the bowel preparation was good. The terminal ileum,                         ileocecal valve, appendiceal orifice, and rectum were                         photographed. Findings:      The perianal and digital rectal examinations were normal. Pertinent       negatives include normal sphincter tone.      The terminal ileum appeared normal. Estimated blood loss: none.      A localized area of granular mucosa was found at  the appendiceal       orifice. Biopsies were taken with a cold forceps for histology.       Estimated blood loss was minimal.      Multiple small-mouthed diverticula were found in the entire colon. More       prominent in left colon than right. Estimated blood loss: none.      Non-bleeding internal hemorrhoids were found during retroflexion. The       hemorrhoids were Grade I (internal hemorrhoids that do not prolapse).       Estimated blood loss: none.      The exam was otherwise without abnormality on direct and retroflexion       views. Impression:            - The examined portion of the ileum was normal.                        - Granularity at the appendiceal orifice. Biopsied.                        - Diverticulosis in the entire examined colon.                        - Non-bleeding internal hemorrhoids.                        - The examination was otherwise normal on direct and                         retroflexion views. Recommendation:        - Patient has a contact number available for                         emergencies. The  signs and symptoms of potential                         delayed complications were discussed with the patient.                         Return to normal activities tomorrow. Written                         discharge instructions were provided to the patient.                        - Discharge patient to home.                        - Resume previous diet.                        - Continue present medications.                        - Await pathology results.                        - Repeat colonoscopy in 5 years for surveillance.                        - Return to GI office as previously scheduled.                        -  The findings and recommendations were discussed with                         the patient. Procedure Code(s):     --- Professional ---                        [phone], Colonoscopy, flexible; with biopsy, single or                         multiple Diagnosis Code(s):     --- Professional ---                        K64.0, First degree hemorrhoids                        K63.89, Other specified diseases of intestine                        K92.1, Melena (includes Hematochezia)                        K57.30, Diverticulosis of large intestine without                         perforation or abscess without bleeding CPT copyright 2019 American Medical Association. All rights reserved. The codes documented in this report are preliminary and upon coder review may  be revised to meet current compliance requirements. Attending Participation:      I personally performed the entire procedure. Volney American, DO Annamaria Helling DO, DO 03/30/2022 10:23:32 AM This report has been signed electronically. Number of Addenda: 0 Note Initiated On: 03/30/2022 9:11 AM Scope Withdrawal Time: 0 hours 21 minutes 0 seconds  Total Procedure Duration: 0 hours 30 minutes 38 seconds  Estimated Blood Loss:  Estimated blood loss was minimal.      [facility]

## 2022-03-31 NOTE — Anesthesia Postprocedure Evaluation (Signed)
Anesthesia Post Note  Patient: Cody Caldwell  Procedure(s) Performed: COLONOSCOPY ESOPHAGOGASTRODUODENOSCOPY (EGD)  Patient location during evaluation: Endoscopy Anesthesia Type: General Level of consciousness: awake and alert Pain management: pain level controlled Vital Signs Assessment: post-procedure vital signs reviewed and stable Respiratory status: spontaneous breathing, nonlabored ventilation, respiratory function stable and patient connected to nasal cannula oxygen Cardiovascular status: blood pressure returned to baseline and stable Postop Assessment: no apparent nausea or vomiting Anesthetic complications: no   No notable events documented.   Last Vitals:  Vitals:   03/30/22 1019 03/30/22 1029  BP: 92/62 107/86  Pulse:    Resp:    Temp: (!) 35.7 C   SpO2:  97%    Last Pain:  Vitals:   03/30/22 1039  TempSrc:   PainSc: 0-No pain                 Dimas Millin

## 2022-04-02 LAB — SURGICAL PATHOLOGY

## 2022-08-03 IMAGING — CR DG CHEST 2V
1 series · 2 of 2 positions shown · non-contrast
Comparison: Chest XR, 08/14/2017.

CLINICAL DATA: Chest pain.

EXAM:
CHEST - 2 VIEW

[Series 1: dg chest 2 view · 0.14mm/px · 2 of 2 slices shown]
[im 1/2]
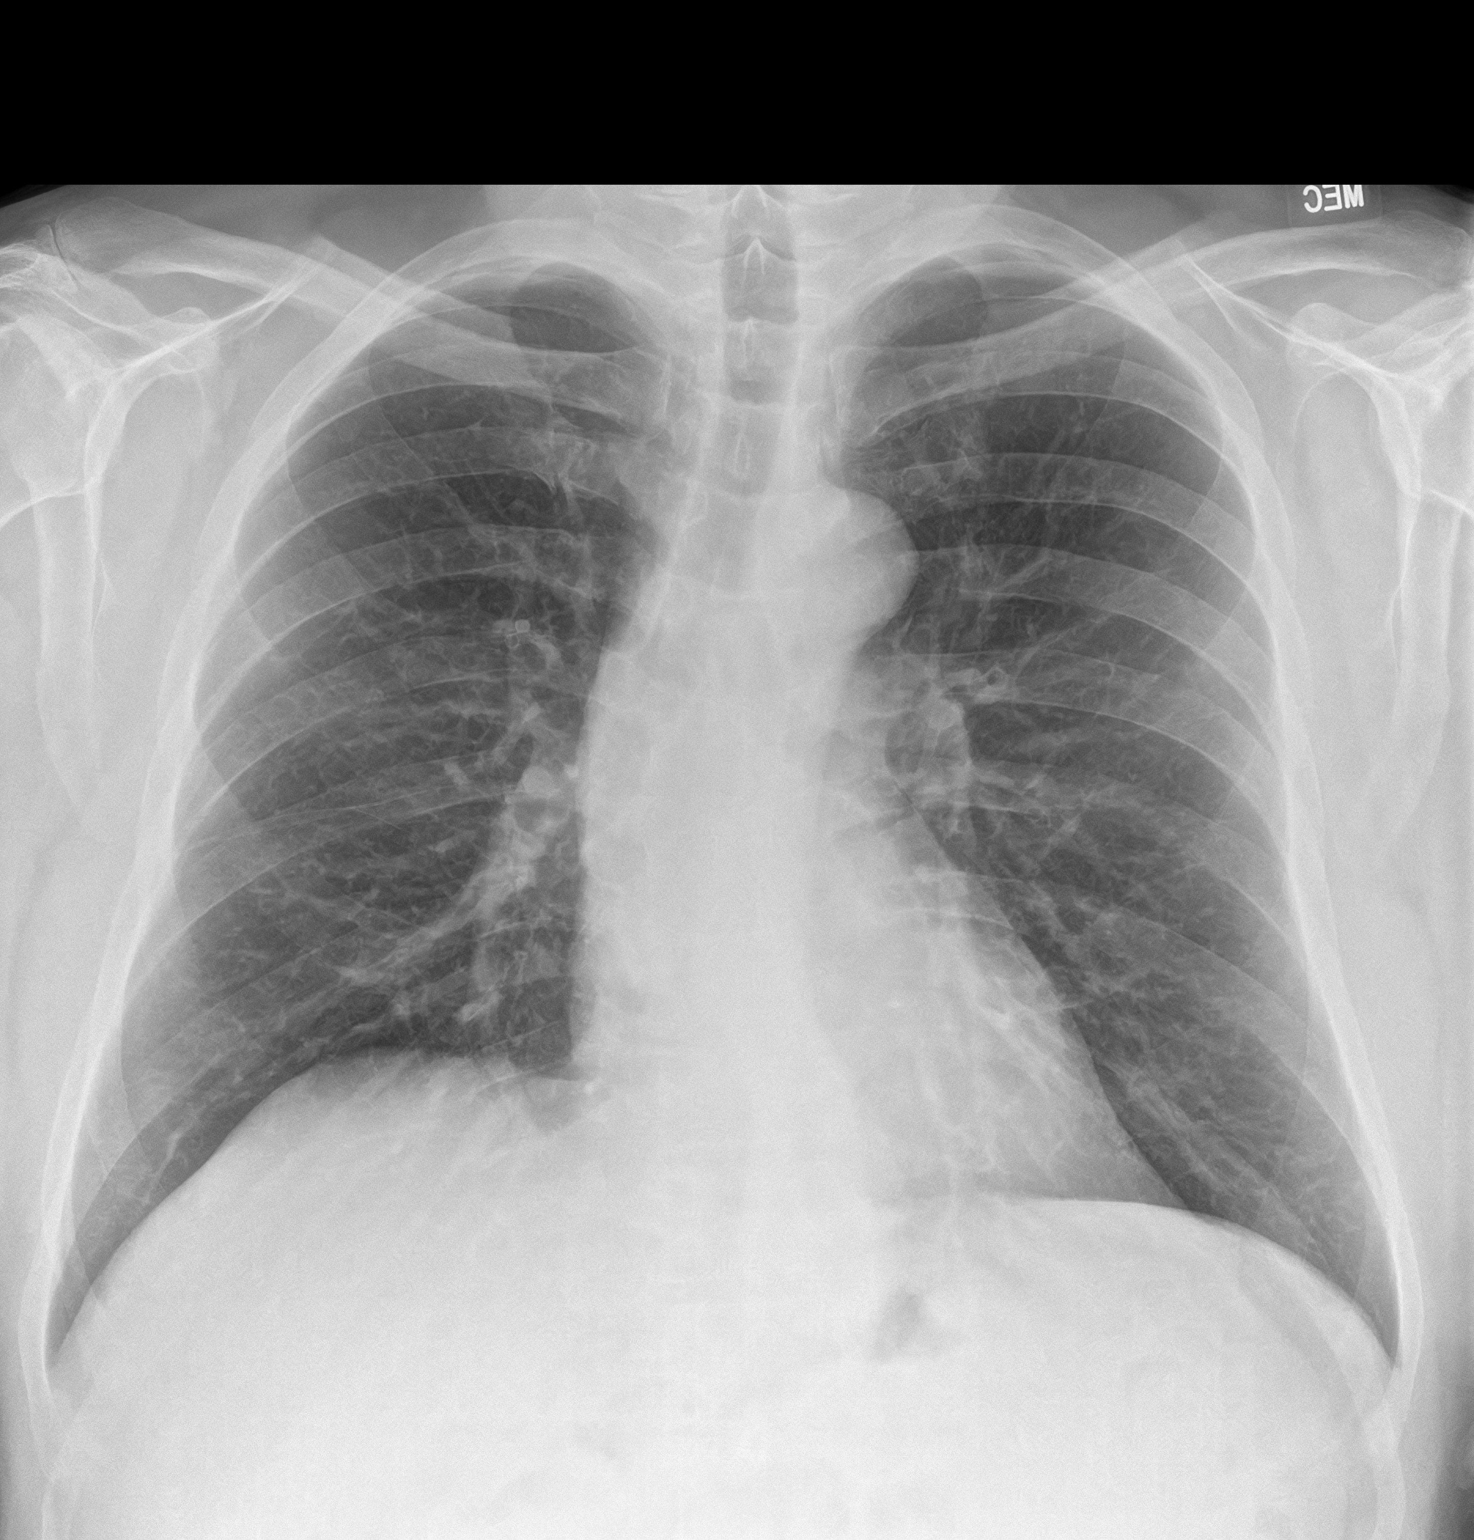
[im 2/2]
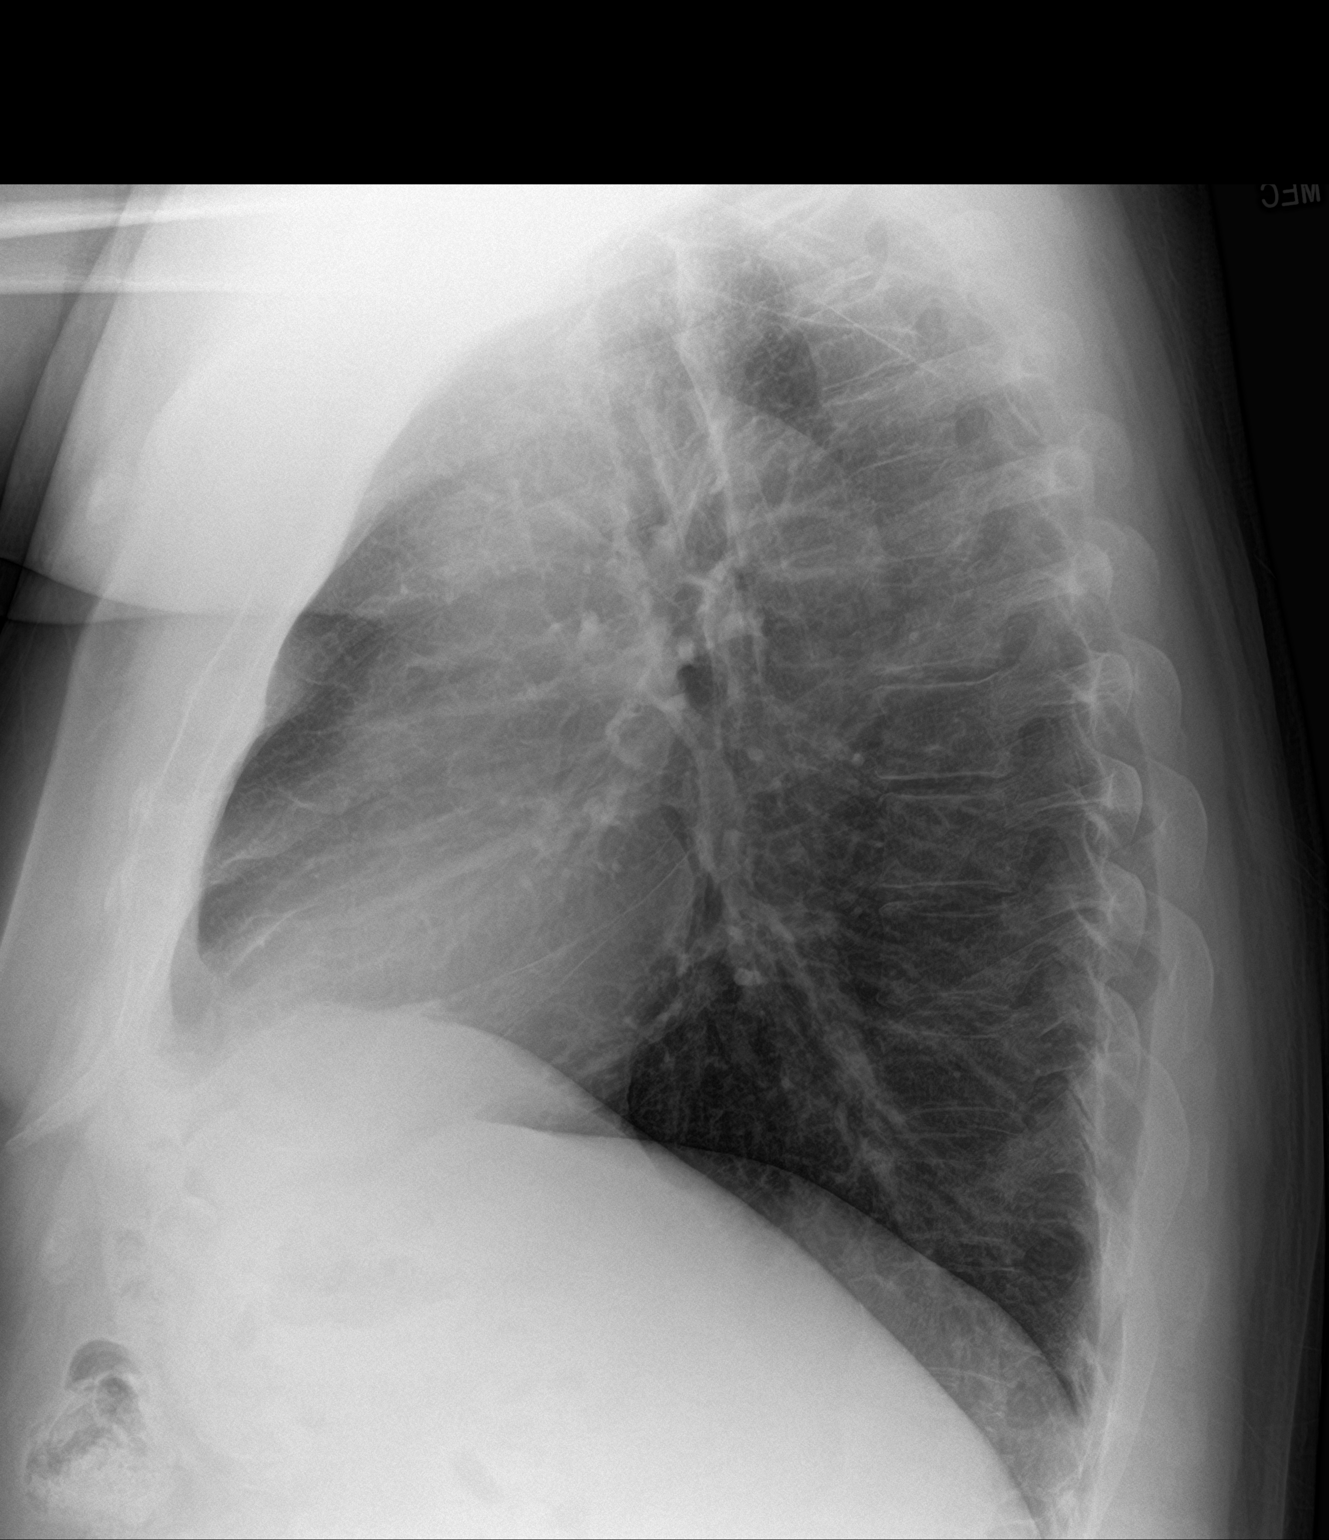

[2 of 2 positions shown; findings below may reference images not displayed]

FINDINGS: The heart size and mediastinal contours are within normal limits.
Both lungs are clear. The visualized skeletal structures are
unremarkable.
IMPRESSION: Normal chest.

## 2024-01-09 MED ORDER — SODIUM CHLORIDE 0.9 % IV SOLN: INTRAVENOUS | Status: DC

## 2024-01-10 ENCOUNTER — Ambulatory Visit

## 2024-01-10 ENCOUNTER — Encounter: Payer: Self-pay | Admitting: Cardiovascular Disease

## 2024-01-10 ENCOUNTER — Other Ambulatory Visit: Payer: Self-pay

## 2024-01-10 ENCOUNTER — Ambulatory Visit
Admission: RE | Admit: 2024-01-10 | Discharge: 2024-01-10 | Disposition: A | Discharge status: Home / Self Care | Attending: Cardiovascular Disease | Admitting: Cardiovascular Disease

## 2024-01-10 ENCOUNTER — Encounter
Admission: RE | Disposition: A | Discharge status: Home / Self Care | Payer: Self-pay | Source: Home / Self Care | Attending: Cardiovascular Disease

## 2024-01-10 ENCOUNTER — Ambulatory Visit
Admission: RE | Admit: 2024-01-10 | Discharge: 2024-01-10 | Disposition: A | Discharge status: Home / Self Care | Source: Home / Self Care

## 2024-01-10 DIAGNOSIS — I119 Hypertensive heart disease without heart failure: Secondary | ICD-10-CM | POA: Diagnosis not present

## 2024-01-10 DIAGNOSIS — I4891 Unspecified atrial fibrillation: Secondary | ICD-10-CM | POA: Diagnosis present

## 2024-01-10 DIAGNOSIS — I340 Nonrheumatic mitral (valve) insufficiency: Secondary | ICD-10-CM | POA: Insufficient documentation

## 2024-01-10 DIAGNOSIS — I4819 Other persistent atrial fibrillation: Secondary | ICD-10-CM

## 2024-01-10 DIAGNOSIS — Z01812 Encounter for preprocedural laboratory examination: Secondary | ICD-10-CM | POA: Diagnosis not present

## 2024-01-10 DIAGNOSIS — Z7901 Long term (current) use of anticoagulants: Secondary | ICD-10-CM | POA: Insufficient documentation

## 2024-01-10 DIAGNOSIS — K219 Gastro-esophageal reflux disease without esophagitis: Secondary | ICD-10-CM | POA: Insufficient documentation

## 2024-01-10 HISTORY — PX: TEE WITHOUT CARDIOVERSION: SHX5443

## 2024-01-10 HISTORY — PX: CARDIOVERSION: SHX1299

## 2024-01-10 LAB — ECHO TEE

## 2024-01-10 SURGERY — ECHOCARDIOGRAM, TRANSESOPHAGEAL
Anesthesia: General

## 2024-01-10 MED ORDER — LIDOCAINE VISCOUS HCL 2 % MT SOLN
OROMUCOSAL | Status: AC
  Filled 2024-01-10: qty 15

## 2024-01-10 MED ORDER — GLYCOPYRROLATE 0.2 MG/ML IJ SOLN
INTRAMUSCULAR | Status: DC | PRN
Start: 2024-01-10 — End: 2024-01-10
  Administered 2024-01-10: .2 mg via INTRAVENOUS

## 2024-01-10 MED ORDER — PROPOFOL 10 MG/ML IV BOLUS
INTRAVENOUS | Status: DC | PRN
  Administered 2024-01-10: 100 mg via INTRAVENOUS

## 2024-01-10 MED ORDER — BUTAMBEN-TETRACAINE-BENZOCAINE 2-2-14 % EX AERO: 1.0000 | INHALATION_SPRAY | Freq: Once | CUTANEOUS | Status: DC

## 2024-01-10 MED ORDER — BUTAMBEN-TETRACAINE-BENZOCAINE 2-2-14 % EX AERO
INHALATION_SPRAY | CUTANEOUS | Status: AC
  Filled 2024-01-10: qty 5

## 2024-01-10 MED ORDER — PHENYLEPHRINE HCL (PRESSORS) 10 MG/ML IV SOLN
INTRAVENOUS | Status: DC | PRN
  Administered 2024-01-10: 80 ug via INTRAVENOUS

## 2024-01-10 MED ORDER — PROPOFOL 500 MG/50ML IV EMUL
INTRAVENOUS | Status: DC | PRN
  Administered 2024-01-10: 125 ug/kg/min via INTRAVENOUS

## 2024-01-10 MED ORDER — LIDOCAINE HCL (PF) 2 % IJ SOLN
INTRAMUSCULAR | Status: DC | PRN
Start: 2024-01-10 — End: 2024-01-10
  Administered 2024-01-10: 60 mg via INTRADERMAL

## 2024-01-10 NOTE — H&P (Signed)
 Pre-procedure History & Physical    Patient ID: [name] MRN: [id number]; DOB: [birth year]   Date of procedure: 01/10/2024  Primary Care Provider: Yehuda Helms, MD Primary Cardiologist: None   Planned procedure:  TEE/DCCV  HPI:   Cody Caldwell is a 63 y.o. male with paroxysmal AF here for elective TEE/DCCV  Past Medical History:  Diagnosis Date   Atrial fibrillation (HCC)    Diverticulosis    Dysrhythmia    a fib   GERD (gastroesophageal reflux disease)    Hemorrhoids, internal, with bleeding    Hyperlipidemia    Hypertension    Pneumonia 06/2015   Rectal fissure    Valvular heart disease    VHD (valvular heart disease)     Past Surgical History:  Procedure Laterality Date   ACHILLES TENDON SURGERY Left 09/25/2019   Procedure: ACHILLES TENDON REPAIR SECONDARY;  Surgeon: Anell Baptist, DPM;  Location: [facility] ORS;  Service: Podiatry;  Laterality: Left;   COLONOSCOPY     COLONOSCOPY N/A 03/30/2022   Procedure: COLONOSCOPY;  Surgeon: Quintin Buckle, DO;  Location: [facility] ENDOSCOPY;  Service: Gastroenterology;  Laterality: N/A;   COLONOSCOPY WITH PROPOFOL  N/A 11/11/2015   Procedure: COLONOSCOPY WITH PROPOFOL ;  Surgeon: Cassie Click, MD;  Location: [facility] ENDOSCOPY;  Service: Endoscopy;  Laterality: N/A;   COLONOSCOPY WITH PROPOFOL  N/A 03/30/2019   Procedure: COLONOSCOPY WITH PROPOFOL ;  Surgeon: Toledo, Alphonsus Jeans, MD;  Location: [facility] ENDOSCOPY;  Service: Gastroenterology;  Laterality: N/A;   ESOPHAGOGASTRODUODENOSCOPY     ESOPHAGOGASTRODUODENOSCOPY N/A 03/30/2022   Procedure: ESOPHAGOGASTRODUODENOSCOPY (EGD);  Surgeon: Quintin Buckle, DO;  Location: [facility] ENDOSCOPY;  Service: Gastroenterology;  Laterality: N/A;   ESOPHAGOGASTRODUODENOSCOPY (EGD) WITH PROPOFOL  N/A 11/11/2015   Procedure: ESOPHAGOGASTRODUODENOSCOPY (EGD) WITH PROPOFOL ;  Surgeon: Cassie Click, MD;  Location: [facility] ENDOSCOPY;  Service: Endoscopy;  Laterality: N/A;   EYE SURGERY Bilateral     lasik   GASTROC RECESSION EXTREMITY Left 09/25/2019   Procedure: GASTROC RECESSION EXTREMITY;  Surgeon: Anell Baptist, DPM;  Location: [facility] ORS;  Service: Podiatry;  Laterality: Left;   KNEE SURGERY Right      Medications Prior to Admission: Prior to Admission medications   Medication Sig Start Date End Date Taking? Authorizing Provider  apixaban (ELIQUIS) 5 MG TABS tablet Take 5 mg by mouth 2 (two) times daily. 01/06/24  Yes [provider]  diltiazem (CARDIZEM CD) 120 MG 24 hr capsule Take 120 mg by mouth daily. 05/10/23  Yes [provider]  diltiazem (CARDIZEM) 60 MG tablet Take 60 mg by mouth 4 (four) times daily as needed (afib).  09/11/19  Yes [provider]  lisinopril (ZESTRIL) 20 MG tablet Take 20 mg by mouth 2 (two) times daily.   Yes [provider]  Multiple Vitamins-Minerals (MULTIVITAMIN WITH MINERALS) tablet Take 1 tablet by mouth daily.   Yes [provider]  naproxen sodium (ALEVE) 220 MG tablet Take 660 mg by mouth 3 (three) times daily as needed (pain).   Yes [provider]  omeprazole (PRILOSEC) 40 MG capsule Take 40 mg by mouth daily.   Yes [provider]  propafenone (RYTHMOL SR) 225 MG 12 hr capsule Take 225 mg by mouth 2 (two) times daily.   Yes [provider]  tadalafil (CIALIS) 20 MG tablet Take 20 mg by mouth daily as needed for erectile dysfunction.   Yes [provider]  topiramate (TOPAMAX) 50 MG tablet Take 50 mg by mouth at bedtime. 05/24/23  Yes [provider]  Allergies:   No Known Allergies  Social History:   Social History   Socioeconomic History   Marital status: Married    Spouse name: Not on file   Number of children: Not on file   Years of education: Not on file   Highest education level: Not on file  Occupational History   Not on file  Tobacco Use   Smoking status: Never   Smokeless tobacco: Never  Vaping Use   Vaping status: Never Used   Substance and Sexual Activity   Alcohol use: Yes    Alcohol/week: 3.0 standard drinks of alcohol    Types: 3 Glasses of wine per week   Drug use: Never   Sexual activity: Not on file  Other Topics Concern   Not on file  Social History Narrative   Lives with wife, Edwina Gram.    Social Drivers of Corporate investment banker Strain: Low Risk  (05/10/2023)   Received from [facility]   Overall Financial Resource Strain (CARDIA)    Difficulty of Paying Living Expenses: Not hard at all  Food Insecurity: No Food Insecurity (05/10/2023)   Received from [facility]   Hunger Vital Sign    Worried About Running Out of Food in the Last Year: Never true    Ran Out of Food in the Last Year: Never true  Transportation Needs: No Transportation Needs (05/10/2023)   Received from [facility] - Transportation    In the past 12 months, has lack of transportation kept you from medical appointments or from getting medications?: No    Lack of Transportation (Non-Medical): No  Physical Activity: Not on file  Stress: Not on file  Social Connections: Not on file  Intimate Partner Violence: Not on file     Family History:   The patient's family history is not on file.    ROS:  Please see the history of present illness.  All other ROS reviewed and negative.     Physical Exam/Data:   Vitals:   01/10/24 1214  Pulse: (!) 106  Resp: (!) 22  Temp: 97.7 F (36.5 C)  TempSrc: Oral  SpO2: 96%  Weight: 114.6 kg  Height: 6\' 2"  (1.88 m)   No intake or output data in the 24 hours ending 01/10/24 1221    01/10/2024   12:14 PM 03/30/2022    8:45 AM 07/17/2021    7:04 AM  Last 3 Weights  Weight (lbs) 252 lb 11.2 oz 235 lb 230 lb  Weight (kg) 114.624 kg 106.595 kg 104.327 kg     Body mass index is 32.44 kg/m.  Wt Readings from Last 3 Encounters:  01/10/24 114.6 kg  03/30/22 106.6 kg  07/17/21 104.3 kg    Physical Exam: General: no  acute distress. Head: Normocephalic, atraumatic  Neck: supple Lungs: nl effort Heart: irreg irreg, tachy, nl s1 s2 Abdomen: Soft Msk:  Strength and tone appear normal for age. Extremities: warm, dry Neuro: awake and alert Psych:  Responds to questions appropriately with a normal affect.    Laboratory Data:  ChemistryNo results for input(s): "NA", "K", "CL", "CO2", "GLUCOSE", "BUN", "CREATININE", "CALCIUM", "GFRNONAA", "GFRAA", "ANIONGAP" in the last 168 hours.  No results for input(s): "PROT", "ALBUMIN", "AST", "ALT", "ALKPHOS", "BILITOT" in the last 168 hours. HematologyNo results for input(s): "WBC", "RBC", "HGB", "HCT", "MCV", "MCH", "MCHC", "RDW", "PLT" in the last 168 hours.  Assessment and Plan   Will proceed w/ TEE-DCCV  ASA III  Anesthesia to be administered by CRNA  Signed, Dorita Garter, MD 01/10/2024, 12:21 PM

## 2024-01-10 NOTE — Anesthesia Preprocedure Evaluation (Addendum)
 Anesthesia Evaluation  Patient identified by MRN, date of birth, ID band Patient awake    Reviewed: Allergy & Precautions, NPO status , Patient's Chart, lab work & pertinent test results  Airway Mallampati: II  TM Distance: >3 FB Neck ROM: Full    Dental  (+) Teeth Intact   Pulmonary neg pulmonary ROS   Pulmonary exam normal breath sounds clear to auscultation       Cardiovascular Exercise Tolerance: Good hypertension, Pt. on medications negative cardio ROS Normal cardiovascular exam+ dysrhythmias Atrial Fibrillation  Rhythm:Regular Rate:Normal     Neuro/Psych negative neurological ROS  negative psych ROS   GI/Hepatic negative GI ROS, Neg liver ROS,GERD  Medicated,,  Endo/Other  negative endocrine ROS    Renal/GU negative Renal ROS  negative genitourinary   Musculoskeletal   Abdominal   Peds negative pediatric ROS (+)  Hematology negative hematology ROS (+)   Anesthesia Other Findings Past Medical History: No date: Atrial fibrillation (HCC) No date: Diverticulosis No date: Dysrhythmia     Comment:  a fib No date: GERD (gastroesophageal reflux disease) No date: Hemorrhoids, internal, with bleeding No date: Hyperlipidemia No date: Hypertension 06/2015: Pneumonia No date: Rectal fissure No date: Valvular heart disease No date: VHD (valvular heart disease)  Past Surgical History: 09/25/2019: ACHILLES TENDON SURGERY; Left     Comment:  Procedure: ACHILLES TENDON REPAIR SECONDARY;  Surgeon:               Anell Baptist, DPM;  Location: [facility] ORS;  Service:               Podiatry;  Laterality: Left; No date: COLONOSCOPY 03/30/2022: COLONOSCOPY; N/A     Comment:  Procedure: COLONOSCOPY;  Surgeon: Quintin Buckle,              DO;  Location: [facility] ENDOSCOPY;  Service:               Gastroenterology;  Laterality: N/A; 11/11/2015: COLONOSCOPY WITH PROPOFOL ; N/A     Comment:  Procedure: COLONOSCOPY WITH  PROPOFOL ;  Surgeon: Cassie Click, MD;  Location: [facility] ENDOSCOPY;  Service:               Endoscopy;  Laterality: N/A; 03/30/2019: COLONOSCOPY WITH PROPOFOL ; N/A     Comment:  Procedure: COLONOSCOPY WITH PROPOFOL ;  Surgeon: Toledo,               Alphonsus Jeans, MD;  Location: [facility] ENDOSCOPY;  Service:               Gastroenterology;  Laterality: N/A; No date: ESOPHAGOGASTRODUODENOSCOPY 03/30/2022: ESOPHAGOGASTRODUODENOSCOPY; N/A     Comment:  Procedure: ESOPHAGOGASTRODUODENOSCOPY (EGD);  Surgeon:               Quintin Buckle, DO;  Location: [facility] ENDOSCOPY;                Service: Gastroenterology;  Laterality: N/A; 11/11/2015: ESOPHAGOGASTRODUODENOSCOPY (EGD) WITH PROPOFOL ; N/A     Comment:  Procedure: ESOPHAGOGASTRODUODENOSCOPY (EGD) WITH               PROPOFOL ;  Surgeon: Cassie Click, MD;  Location: [facility]              ENDOSCOPY;  Service: Endoscopy;  Laterality: N/A; No date: EYE SURGERY; Bilateral     Comment:  lasik 09/25/2019: GASTROC RECESSION EXTREMITY; Left     Comment:  Procedure: GASTROC RECESSION EXTREMITY;  Surgeon:  Anell Baptist, DPM;  Location: [facility] ORS;  Service:               Podiatry;  Laterality: Left; No date: KNEE SURGERY; Right  BMI    Body Mass Index: 32.44 kg/m      Reproductive/Obstetrics negative OB ROS                             Anesthesia Physical Anesthesia Plan  ASA: 2  Anesthesia Plan: General   Post-op Pain Management:    Induction: Intravenous  PONV Risk Score and Plan: Propofol  infusion and TIVA  Airway Management Planned: Natural Airway and Nasal Cannula  Additional Equipment:   Intra-op Plan:   Post-operative Plan:   Informed Consent: I have reviewed the patients History and Physical, chart, labs and discussed the procedure including the risks, benefits and alternatives for the proposed anesthesia with the patient or authorized representative who has indicated his/her  understanding and acceptance.     Dental Advisory Given  Plan Discussed with: CRNA  Anesthesia Plan Comments:        Anesthesia Quick Evaluation

## 2024-01-10 NOTE — Transfer of Care (Signed)
 Immediate Anesthesia Transfer of Care Note  Patient: Cody Caldwell  Procedure(s) Performed: ECHOCARDIOGRAM, TRANSESOPHAGEAL CARDIOVERSION  Patient Location: PACU  Anesthesia Type:General  Level of Consciousness: awake, alert , oriented, and patient cooperative  Airway & Oxygen  Therapy: Patient Spontanous Breathing and Patient connected to nasal cannula oxygen   Post-op Assessment: Report given to RN, Post -op Vital signs reviewed and stable, and Patient moving all extremities X 4  Post vital signs: Reviewed and stable  Last Vitals:  Vitals Value Taken Time  BP 104/51 01/10/24 1342  Temp    Pulse 74 01/10/24 1349  Resp 23 01/10/24 1349  SpO2 95 % 01/10/24 1349    Last Pain:  Vitals:   01/10/24 1214  TempSrc: Oral  PainSc: 0-No pain         Complications: No notable events documented.

## 2024-01-10 NOTE — Progress Notes (Signed)
 Transesophageal echocardiogram preliminary report  Cody Caldwell [id number] 1961-02-12  Preliminary diagnosis AF  Postprocedural diagnosis same  Time out A timeout was performed by the nursing staff and physicians specifically identifying the procedure performed, identification of the patient, the type of sedation, all allergies and medications, all pertinent medical history, and presedation assessment of nasopharynx.  The patient and or family understand the risks of the procedure including the rare risks of death, stroke, heart attack, esophogeal perforation, sore throat, and reaction to medications given.  General anesthesia CRNA administered GA. See MAR. 315 mg propofol . The patient had continued monitoring of heart rate, oxygenation, blood pressure, respiratory rate, and extent of signs of sedation throughout the entire procedure.  The patient received anesthesia over a period of 20 minutes.  CRNA, nursing staff and I were present during the procedure when the patient was anesthetized for 100% of the time.  Treatment considerations No LAA thrombus  See separately dictated DCCV note  For further details of transesophageal echocardiogram please refer to final report.  Cody Caldwell [facility] MD [facility] 01/10/2024 1:39 PM

## 2024-01-10 NOTE — Procedures (Signed)
 Electrical Cardioversion Procedure Note  Procedure: Electrical Cardioversion Indications:  Atrial Fibrillation  Procedure Details Consent: Risks of procedure as well as the alternatives and risks of each were explained to the (patient/caregiver).  Consent for procedure obtained. Time Out Performed: Verified patient identification, verified procedure, site/side was marked, verified correct patient position, special equipment/implants available, medications/allergies/relevent history reviewed, required imaging and test results available.   Patient placed on cardiac monitor, pulse oximetry, supplemental oxygen  as necessary.  Sedation given: propofol  Pacer pads placed anterior and posterior chest.  TEE performed prior to DCCV. See separately dictated TEE note.   Cardioverted 1 time(s).  Cardioverted at 200J.  Evaluation Findings: Post procedure EKG shows: NSR Complications: None Patient did tolerate procedure well.   Dorita Garter MD [facility] 1:41 PM 01/10/24

## 2024-01-10 NOTE — Progress Notes (Signed)
*  PRELIMINARY RESULTS* Echocardiogram Echocardiogram Transesophageal has been performed.  Cody Caldwell 01/10/2024, 1:47 PM

## 2024-01-14 ENCOUNTER — Encounter: Payer: Self-pay | Admitting: Cardiovascular Disease

## 2024-01-31 NOTE — Anesthesia Postprocedure Evaluation (Signed)
 Anesthesia Post Note  Patient: Cody Caldwell  Procedure(s) Performed: ECHOCARDIOGRAM, TRANSESOPHAGEAL CARDIOVERSION  Patient location during evaluation: PACU Anesthesia Type: General Level of consciousness: awake Pain management: pain level controlled Vital Signs Assessment: post-procedure vital signs reviewed and stable Respiratory status: spontaneous breathing Cardiovascular status: stable Anesthetic complications: no   No notable events documented.   Last Vitals:  Vitals:   01/10/24 1430 01/10/24 1445  BP: 110/79 121/77  Pulse: 71 73  Resp: 14 16  Temp:    SpO2: 96% 96%    Last Pain:  Vitals:   01/10/24 1400  TempSrc: Oral  PainSc:                  VAN STAVEREN,[name]

## 2024-07-07 ENCOUNTER — Other Ambulatory Visit: Payer: Self-pay | Admitting: Otolaryngology

## 2024-07-07 DIAGNOSIS — H90A22 Sensorineural hearing loss, unilateral, left ear, with restricted hearing on the contralateral side: Secondary | ICD-10-CM

## 2024-07-31 ENCOUNTER — Ambulatory Visit
Admission: RE | Admit: 2024-07-31 | Discharge: 2024-07-31 | Disposition: A | Source: Ambulatory Visit | Attending: Otolaryngology | Admitting: Otolaryngology

## 2024-07-31 DIAGNOSIS — H90A22 Sensorineural hearing loss, unilateral, left ear, with restricted hearing on the contralateral side: Secondary | ICD-10-CM

## 2024-07-31 MED ORDER — GADOPICLENOL 0.5 MMOL/ML IV SOLN
10.0000 mL | Freq: Once | INTRAVENOUS | Status: AC | PRN
  Administered 2024-07-31: 9 mL via INTRAVENOUS

## 2024-08-03 ENCOUNTER — Other Ambulatory Visit
